# Patient Record
Sex: Male | Born: 1955
Health system: Southern US, Community
[De-identification: ages and names within clinical notes are randomized; demographics above are authoritative.]

## PROBLEM LIST (undated history)

## (undated) DIAGNOSIS — E785 Hyperlipidemia, unspecified: Secondary | ICD-10-CM

## (undated) DIAGNOSIS — Z8679 Personal history of other diseases of the circulatory system: Secondary | ICD-10-CM

## (undated) DIAGNOSIS — K5790 Diverticulosis of intestine, part unspecified, without perforation or abscess without bleeding: Secondary | ICD-10-CM

## (undated) DIAGNOSIS — C4491 Basal cell carcinoma of skin, unspecified: Secondary | ICD-10-CM

## (undated) DIAGNOSIS — L570 Actinic keratosis: Secondary | ICD-10-CM

## (undated) DIAGNOSIS — J310 Chronic rhinitis: Secondary | ICD-10-CM

## (undated) HISTORY — DX: Hyperlipidemia, unspecified: E78.5

## (undated) HISTORY — DX: Diverticulosis of intestine, part unspecified, without perforation or abscess without bleeding: K57.90

## (undated) HISTORY — PX: KNEE ARTHROSCOPY W/ MENISCAL REPAIR: SHX1877

## (undated) HISTORY — DX: Basal cell carcinoma of skin, unspecified: C44.91

## (undated) HISTORY — DX: Personal history of other diseases of the circulatory system: Z86.79

## (undated) HISTORY — DX: Chronic rhinitis: J31.0

## (undated) HISTORY — PX: COLONOSCOPY: SHX174

## (undated) HISTORY — DX: Actinic keratosis: L57.0

---

## 2005-06-02 ENCOUNTER — Ambulatory Visit: Payer: Self-pay | Admitting: Internal Medicine

## 2006-03-09 ENCOUNTER — Ambulatory Visit: Payer: Self-pay | Admitting: Internal Medicine

## 2007-03-13 ENCOUNTER — Ambulatory Visit: Payer: Self-pay | Admitting: Internal Medicine

## 2007-03-13 LAB — CONVERTED CEMR LAB
Basophils Relative: 0.8 % (ref 0.0–1.0)
CO2: 29 meq/L (ref 19–32)
Calcium: 9.3 mg/dL (ref 8.4–10.5)
Eosinophils Absolute: 0.3 10*3/uL (ref 0.0–0.6)
Eosinophils Relative: 6 % — ABNORMAL HIGH (ref 0.0–5.0)
GFR calc Af Amer: 132 mL/min
Glucose, Bld: 108 mg/dL — ABNORMAL HIGH (ref 70–99)
HCT: 43.7 % (ref 39.0–52.0)
Hemoglobin: 15.5 g/dL (ref 13.0–17.0)
Leukocytes, UA: NEGATIVE
Lymphocytes Relative: 28.3 % (ref 12.0–46.0)
MCV: 89.2 fL (ref 78.0–100.0)
Monocytes Absolute: 0.4 10*3/uL (ref 0.2–0.7)
Neutro Abs: 2.9 10*3/uL (ref 1.4–7.7)
Neutrophils Relative %: 57.1 % (ref 43.0–77.0)
Nitrite: NEGATIVE
Sodium: 140 meq/L (ref 135–145)
Specific Gravity, Urine: 1.025 (ref 1.000–1.03)
TSH: 2.94 microintl units/mL (ref 0.35–5.50)
Total Protein, Urine: NEGATIVE mg/dL
Triglycerides: 186 mg/dL — ABNORMAL HIGH (ref 0–149)
WBC: 5 10*3/uL (ref 4.5–10.5)

## 2007-04-12 ENCOUNTER — Ambulatory Visit: Payer: Self-pay | Admitting: Gastroenterology

## 2007-04-26 ENCOUNTER — Ambulatory Visit: Payer: Self-pay | Admitting: Gastroenterology

## 2008-04-25 DIAGNOSIS — Z87891 Personal history of nicotine dependence: Secondary | ICD-10-CM

## 2008-04-25 DIAGNOSIS — Z8679 Personal history of other diseases of the circulatory system: Secondary | ICD-10-CM | POA: Insufficient documentation

## 2008-04-25 DIAGNOSIS — J310 Chronic rhinitis: Secondary | ICD-10-CM | POA: Insufficient documentation

## 2008-04-25 DIAGNOSIS — E7800 Pure hypercholesterolemia, unspecified: Secondary | ICD-10-CM

## 2008-04-26 ENCOUNTER — Ambulatory Visit: Payer: Self-pay | Admitting: Internal Medicine

## 2008-04-26 DIAGNOSIS — G4700 Insomnia, unspecified: Secondary | ICD-10-CM | POA: Insufficient documentation

## 2008-04-26 DIAGNOSIS — M542 Cervicalgia: Secondary | ICD-10-CM

## 2008-06-25 ENCOUNTER — Telehealth (INDEPENDENT_AMBULATORY_CARE_PROVIDER_SITE_OTHER): Payer: Self-pay | Admitting: *Deleted

## 2008-10-23 ENCOUNTER — Ambulatory Visit: Payer: Self-pay | Admitting: Internal Medicine

## 2008-10-23 DIAGNOSIS — N320 Bladder-neck obstruction: Secondary | ICD-10-CM | POA: Insufficient documentation

## 2008-10-23 DIAGNOSIS — F528 Other sexual dysfunction not due to a substance or known physiological condition: Secondary | ICD-10-CM

## 2008-10-25 LAB — CONVERTED CEMR LAB
Nitrite: NEGATIVE
PSA: 2.91 ng/mL (ref 0.10–4.00)
Specific Gravity, Urine: >=1.03 (ref 1.000–1.03)
Total Protein, Urine: NEGATIVE mg/dL
pH: 6 (ref 5.0–8.0)

## 2009-06-12 ENCOUNTER — Ambulatory Visit: Payer: Self-pay | Admitting: Internal Medicine

## 2010-01-19 ENCOUNTER — Telehealth: Payer: Self-pay | Admitting: Internal Medicine

## 2010-01-21 ENCOUNTER — Ambulatory Visit: Payer: Self-pay | Admitting: Internal Medicine

## 2010-01-21 DIAGNOSIS — R358 Other polyuria: Secondary | ICD-10-CM

## 2010-01-23 LAB — CONVERTED CEMR LAB
Bilirubin Urine: NEGATIVE
Calcium: 9.3 mg/dL (ref 8.4–10.5)
Cholesterol: 190 mg/dL (ref 0–200)
Direct LDL: 116.2 mg/dL
GFR calc non Af Amer: 107.51 mL/min (ref >60)
HDL: 37.2 mg/dL — ABNORMAL LOW (ref >39.00)
Ketones, ur: NEGATIVE mg/dL
Sodium: 141 meq/L (ref 135–145)
Total Protein, Urine: NEGATIVE mg/dL
Triglycerides: 232 mg/dL — ABNORMAL HIGH (ref 0.0–149.0)
pH: 6 (ref 5.0–8.0)

## 2010-07-13 ENCOUNTER — Telehealth: Payer: Self-pay | Admitting: Internal Medicine

## 2011-01-01 ENCOUNTER — Telehealth: Payer: Self-pay | Admitting: Adult Health

## 2011-01-24 LAB — CONVERTED CEMR LAB
ALT: 25 units/L (ref 0–53)
AST: 24 units/L (ref 0–37)
AST: 24 units/L (ref 0–37)
Albumin: 3.8 g/dL (ref 3.5–5.2)
Alkaline Phosphatase: 55 units/L (ref 39–117)
Alkaline Phosphatase: 59 units/L (ref 39–117)
BUN: 18 mg/dL (ref 6–23)
Basophils Absolute: 0 10*3/uL (ref 0.0–0.1)
Basophils Absolute: 0 10*3/uL (ref 0.0–0.1)
Basophils Relative: 0.6 % (ref 0.0–3.0)
Bilirubin, Direct: 0.1 mg/dL (ref 0.0–0.3)
CO2: 30 meq/L (ref 19–32)
CRP, High Sensitivity: <1 (ref 0.00–5.00)
Calcium: 9.2 mg/dL (ref 8.4–10.5)
Chloride: 104 meq/L (ref 96–112)
Chloride: 107 meq/L (ref 96–112)
Cholesterol: 202 mg/dL — ABNORMAL HIGH (ref 0–200)
Creatinine, Ser: 0.8 mg/dL (ref 0.4–1.5)
Direct LDL: 146.7 mg/dL
Eosinophils Absolute: 0.2 10*3/uL (ref 0.0–0.7)
Eosinophils Absolute: 0.2 10*3/uL (ref 0.0–0.7)
Eosinophils Relative: 4.9 % (ref 0.0–5.0)
Eosinophils Relative: 6.1 % — ABNORMAL HIGH (ref 0.0–5.0)
GFR calc non Af Amer: 107.76 mL/min (ref >60)
GFR calc non Af Amer: 108 mL/min
Glucose, Bld: 95 mg/dL (ref 70–99)
HCT: 43.7 % (ref 39.0–52.0)
HDL: 28.6 mg/dL — ABNORMAL LOW (ref >39.0)
HDL: 33.6 mg/dL — ABNORMAL LOW (ref >39.00)
Hemoglobin: 15.3 g/dL (ref 13.0–17.0)
LDL Cholesterol: 168 mg/dL — ABNORMAL HIGH (ref 0–99)
Lymphocytes Relative: 32 % (ref 12.0–46.0)
Lymphocytes Relative: 32.6 % (ref 12.0–46.0)
Lymphs Abs: 1.2 10*3/uL (ref 0.7–4.0)
MCHC: 34.4 g/dL (ref 30.0–36.0)
MCHC: 34.9 g/dL (ref 30.0–36.0)
MCV: 90.5 fL (ref 78.0–100.0)
MCV: 91.6 fL (ref 78.0–100.0)
Monocytes Absolute: 0.3 10*3/uL (ref 0.1–1.0)
Monocytes Relative: 8.7 % (ref 3.0–12.0)
Neutro Abs: 2.1 10*3/uL (ref 1.4–7.7)
Neutrophils Relative %: 52 % (ref 43.0–77.0)
Neutrophils Relative %: 54.1 % (ref 43.0–77.0)
Platelets: 182 10*3/uL (ref 150.0–400.0)
Platelets: 232 10*3/uL (ref 150–400)
Potassium: 3.9 meq/L (ref 3.5–5.1)
Potassium: 4.4 meq/L (ref 3.5–5.1)
RBC: 4.77 M/uL (ref 4.22–5.81)
RDW: 11.9 % (ref 11.5–14.6)
Sodium: 140 meq/L (ref 135–145)
TSH: 3.42 microintl units/mL (ref 0.35–5.50)
Total Bilirubin: 0.9 mg/dL (ref 0.3–1.2)
Total Bilirubin: 1.1 mg/dL (ref 0.3–1.2)
Total CHOL/HDL Ratio: 6
Total Protein: 6.7 g/dL (ref 6.0–8.3)
Triglycerides: 121 mg/dL (ref 0.0–149.0)
VLDL: 24.2 mg/dL (ref 0.0–40.0)
VLDL: 28 mg/dL (ref 0–40)
WBC: 3.8 10*3/uL — ABNORMAL LOW (ref 4.5–10.5)
WBC: 4.5 10*3/uL (ref 4.5–10.5)

## 2011-01-26 NOTE — Progress Notes (Signed)
Summary: needs physical this week/ ins coverage runs out  Phone Note Call from Patient   Caller: Spouse Call For: Mallory Schaad Summary of Call: pt's spouse wants pt to have a cpx this week. says she lost her job last week and wil only be able to cover pt's physical if he has it this week.  spousearris meyn # [phone] Initial call taken by: Tivis Ringer, CNA,  January 19, 2010 12:26 PM  Follow-up for Phone Call        Please advise if I can use another slot for pt physical. Pt needs tohave it this week in order for insurance to pay. Please advise. Carron Curie CMA  January 19, 2010 2:21 PM Physical not due, nothing abn on the last one in July 2010 to warrant anothe complete eval though cholesterol was up a bit and if wants to have this rechecked this week just get a fasting lipid profile 272.4 and make the appt anytime this week Follow-up by: Nyoka Cowden MD,  January 19, 2010 2:23 PM  Additional Follow-up for Phone Call Additional follow up Details #1::        per pt wife, pt has been having problems with his prostate and wants thsi checked. I advised he did not need cpx for this. pt scheduled to see MW on 01/21/10 at 10:30. Carron Curie CMA  January 19, 2010 2:45 PM

## 2011-01-26 NOTE — Assessment & Plan Note (Signed)
Summary: Primary svc/ new onset polyuria     Primary Provider/Referring Provider:  [name]  CC:  Followup.  Pt wanted to come in before insurance runs out.  Pt c/o frequent urination x 3 months.  He sometimes get up at least once at night to urinate. Joel Franco  History of Present Illness: 55 yowm  remote minimal smoker with chronic palpitations that typically occur more at rest than they do with activity and not worsened over the last year.    Apr 26, 2008 CPX  October 23, 2008 ov co sev months increased frequency, urgency nocturia,mild dysuria and decreased fos no fever or back pain .   Also decreased libido that he attributes to stress at work.   June 12, 2009 cpx no urinary problems, overall doing fine except for problems staying asleep.  January 21, 2010 Followup.  Pt wanted to come in before insurance runs out.  Pt c/o frequent urination x 3 months.  He sometimes get up at least once at night to urinate. No dysuria or fever or flank pain. Minimal decrease fos.  Current Medications (verified): 1)  Advil Migraine 200 Mg  Caps (Ibuprofen) .... Prn  Allergies (verified): No Known Drug Allergies  Past History:  Past Medical History: HYPERLIPIDEMIA (ICD-272.4)     -  target LDL < 130  based on positive family history and remote  smoking hx CHRONIC RHINITIS (ICD-472.0) PALPITATIONS, HX OF (ICD-V12.50) DIVERTICULOSIS.............................................................................Joel Franco     - See colonoscopy 04/26/07 neck pain positive MRI with right C6/7 protrusion ......................Joel Franco Dr. Magnus Ivan HEALTH MAINTENANCE...................................................................Joel KitchenWert      - CPX  06/12/2009      - DT 3/04      - Pneumovax6/06 and January 21, 2010     Vital Signs:  Patient profile:   55 year old male Weight:      217 pounds O2 Sat:      97 % on Room air Temp:     98.0 degrees F oral Pulse rate:   57 / minute BP sitting:   118 / 85  (left  arm)  Vitals Entered By: Vernie Murders (January 21, 2010 10:54 AM)  O2 Flow:  Room air  Physical Exam  Additional Exam:  Ambulatory healthy appearing anxious wm nad Afeb with normal vital signs  wt 224 > 226 October 23, 2008 > 218 06/12/2009 > 217 January 21, 2010  HEENT: nl dentition, turbinates, and orophanx.  moderate wax impaction of both external ear canals without cough reflex Neck without JVD/Nodes/TM carotid upstrokes normal without bruits. limited abduction to about 30 degrees with no radicular features listed Lungs clear to A and P bilaterally without cough on insp or exp maneuvers RRR no s3 or murmur or increase in P2 no displacement of PMI Abd soft and benign with nl excursion in the supine position. No bruits or organomegaly Ext warm without calf tenderness, cyanosis clubbing or edema  Skin warm and dry without lesions      Sodium                    141 mEq/L                   135-145   Potassium                 4.6 mEq/L                   3.5-5.1   Chloride  104 mEq/L                   96-112   Carbon Dioxide            29 mEq/L                    19-32   Glucose              [H]  101 mg/dL                   29-56   BUN                       12 mg/dL                    2-13   Creatinine                0.8 mg/dL                   0.8-6.5   Calcium                   9.3 mg/dL                   7.8-46.9   GFR                       107.51 mL/min               >60  Tests: (2) Lipid Panel (LIPID)   Cholesterol               190 mg/dL                   6-295     ATP III Classification            Desirable:  < 200 mg/dL                    Borderline High:  200 - 239 mg/dL               High:  > = 240 mg/dL   Triglycerides        [H]  232.0 mg/dL                 2.8-413.2     Normal:  <150 mg/dL     Borderline High:  440 - 199 mg/dL   HDL                  [L]  10.27 mg/dL                 >25.36   VLDL Cholesterol     [H]  46.4 mg/dL                   6.4-40.3  CHO/HDL Ratio:  CHD Risk                             5                    Men          Women     1/2 Average Risk     3.4          3.3     Average Risk  5.0          4.4     2X Average Risk          9.6          7.1     3X Average Risk          15.0          11.0                           Tests: (3) Full Range CRP (FCRP)   Full Range CRP            0.20 mg/L                   0.00-5.00     Note:  An elevated hs-CRP (>5 mg/L) should be repeated after 2 weeks to rule out recent infection or trauma.  Tests: (4) UDip Only (UDIP)   Color                     LT. YELLOW       RANGE:  Yellow;Lt. Yellow   Clarity                   CLEAR                       Clear   Specific Gravity          1.020                       1.000 - 1.030   Urine Ph                  6.0                         5.0-8.0   Protein                   NEGATIVE                    Negative   Urine Glucose             NEGATIVE                    Negative   Ketones                   NEGATIVE                    Negative   Urine Bilirubin           NEGATIVE                    Negative   Blood                     NEGATIVE                    Negative   Urobilinogen              0.2                         0.0 - 1.0   Leukocyte Esterace        NEGATIVE  Negative   Nitrite                   NEGATIVE                    Negative  Tests: (5) Testosterone, Total (TESTO)   Testosterone              418.88 ng/dL                324.40-102.72  Tests: (6) Cholesterol LDL - Direct (DIRLDL)  Cholesterol LDL - Direct                             116.2 mg/dL  Impression & Recommendations:  Problem # 1:  POLYURIA (ZDG-644.03) no evidence of significant renal problem or dm, try empiric cardura, urology f/u prn Orders: Est. Patient Level III (47425) TLB-BMP (Basic Metabolic Panel-BMET) (80048-METABOL) TLB-Udip ONLY (81003-UDIP)  Problem # 2:  INADEQUATE SLEEP HYGIENE (PERSISTENT)  (ICD-307.42)  discussed sleep hygiene  Orders: Est. Patient Level III (95638)  Problem # 3:  HYPERLIPIDEMIA (ICD-272.4)  Orders: TLB-Lipid Panel (80061-LIPID) TLB-CRP-High Sensitivity (C-Reactive Protein) (86140-FCRP)  Labs Reviewed: SGOT: 24 (06/12/2009)   SGPT: 25 (06/12/2009)   HDL:33.60 (06/12/2009), 28.6 (04/26/2008)  LDL:168 (04/26/2008) > 116 January 21, 2010 so at goal  DEL (03/13/2007)  Chol:202 (06/12/2009), 225 (04/26/2008)  Trig:121.0 (06/12/2009), 142 (04/26/2008)  Medications Added to Medication List This Visit: 1)  Cardura 4 Mg Tabs (Doxazosin mesylate) .... One half at bedtime  Other Orders: Pneumococcal Vaccine (75643) Admin 1st Vaccine (32951) TLB-Testosterone, Total (84403-TESTO)  Patient Instructions: 1)  start cardura 4 mg one half at bedtime 2)  Avoid all caffeine after am coffee and no decongestants and read by soft light for at least 30 min before bed same time every night Prescriptions: CARDURA 4 MG TABS (DOXAZOSIN MESYLATE) one half at bedtime  #15 x 11   Entered and Authorized by:   Nyoka Cowden MD   Signed by:   Nyoka Cowden MD on 01/21/2010   Method used:   Print then Give to Patient   RxID:   [id number]    Immunizations Administered:  Pneumonia Vaccine:    Vaccine Type: Pneumovax    Site: left deltoid    Mfr: Merck    Dose: 0.5 ml    Route: IM    Given by: Vernie Murders    Exp. Date: 01/22/2011    Lot #: [id number]    VIS given: 07/24/96 version given January 21, 2010.

## 2011-01-26 NOTE — Progress Notes (Signed)
Summary: Needs Urgent care eval  Phone Note Call from Patient Call back at [phone]   Caller: Ellwood Sayers Call For: Nyala Kirchner Reason for Call: Talk to Nurse Summary of Call: pt's son, 55year old, does not have primary physician.  Pt's son not feeling well, over w/e nauseated, temp 101, sinus drainage, sore throat, some cough.  Would like to come see you and have you be his primary.  Please respond Initial call taken by: Eugene Gavia,  July 13, 2010 4:36 PM  Follow-up for Phone Call        Will forward message to MW to address if ok or not to see pt as PCP.  Aundra Millet Reynolds LPN  July 13, 2010 4:41 PM  ok me but sounds like he needs to be seen more urgently, rec urgent care eval then set up for cpx with me Follow-up by: Nyoka Cowden MD,  July 13, 2010 5:23 PM  Additional Follow-up for Phone Call Additional follow up Details #1::        called and spoke with cindy Narayanan and she is aware of MW recs  to follow up with [facility] and then call and make appt for cpx with MW.  cindy voiced her understanding of this and will call back for appt. Randell Loop CMA  July 13, 2010 5:27 PM

## 2011-01-28 NOTE — Progress Notes (Signed)
Summary: rx request / itching  Phone Note Call from Patient Call back at Home Phone [phone]   Caller: Spouse CYNTHIA Call For: [name] Summary of Call: per spouse- pt was previously given a cream to help with an "itch" in pt's private area. he needs this again (sorry, i did not get any more details- will let nurse do this). [employer]. call pt at home # or cell [phone] Initial call taken by: Tivis Ringer, CNA,  January 01, 2011 3:33 PM  Follow-up for Phone Call        TP, pls advise if okay to refill pt's clotrimazole betamethasone cream, thanks! Vernie Murders  January 01, 2011 4:18 PM   Additional Follow-up for Phone Call Additional follow up Details #1::        that is fine but for the groin area or under skin folds not directly on genitals.  Additional Follow-up by: Rubye Oaks NP,  January 01, 2011 4:21 PM    Additional Follow-up for Phone Call Additional follow up Details #2::    rx sent and pt wife aware of recs. Carron Curie CMA  January 01, 2011 4:28 PM   New/Updated Medications: CLOTRIMAZOLE-BETAMETHASONE 1-0.05 % CREA (CLOTRIMAZOLE-BETAMETHASONE) as directed Prescriptions: CLOTRIMAZOLE-BETAMETHASONE 1-0.05 % CREA (CLOTRIMAZOLE-BETAMETHASONE) as directed  #1 tube x 0   Entered by:   Carron Curie CMA   Authorized by:   Rubye Oaks NP   Signed by:   Carron Curie CMA on 01/01/2011   Method used:   Electronically to        [facility]* (retail)       [address] [place], [place]  [address]       Ph: [phone]       Fax: [phone]   RxID:   [id number]

## 2011-05-14 NOTE — Assessment & Plan Note (Signed)
Joel Franco                             PULMONARY OFFICE NOTE   Joel Franco, Joel Franco                      MRN:          [id number]  DATE:03/13/2007                            DOB:          December 29, 1955    This is a primary service comprehensive health care evaluation.   HISTORY:  This is a 55 year old white male with chronic palpitations  that typically occur more at rest than they do with activity and not  worsened over the last year.  She also has intermittent nasal congestion  but controls this with p.r.n. over-the-counter medications with no  significant seasonal or weather-related variation.  He denies any  exertional chest pain, orthopnea, PND or leg swelling.  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  He also  denies any orthopnea, PND, TIA or claudication symptoms.   PAST MEDICAL HISTORY:  1. Benign palpitations, nonexertional, worse with caffeine.  2. Chronic rhinitis, on p.r.n. Flonase seasonally.  3. Hyperlipidemia with relatively low HDL and a target LDL less than      30 based on male gender and past positive personal history of      smoking as well as family history of heart disease.   ALLERGIES:  NONE KNOWN.   MEDICATIONS:  None regularly.   SOCIAL HISTORY:  He quit smoking 15 years ago.  He works as a  Microbiologist.   FAMILY HISTORY:  1. Positive for heart disease in his mother in her 65's.  2. Brother has allergies.  3. Positive for ischemic heart disease also in his father at age 40.  4. One of his siblings has Parkinson's disease and possible liver      cancer.   REVIEW OF SYSTEMS:  Taken in detail on the worksheet and significant for  the problems as outlined above.   PHYSICAL EXAMINATION:  This is a stoic, pleasant ambulatory white male  in no acute distress.  He is afebrile with normal vital signs.  Weight  is 219 pounds, which is no change from a year ago.  HEENT:  Moderate bilateral wax retention was present.  Nasal turbinates  were normal.  Limited ocular exam was benign.  Oropharynx was clear.  Dentition was intact.  NECK:  Supple without cervical adenopathy or tenderness.  Carotid  upstrokes were brisk without any bruits.  CHEST:  Completely clear bilaterally to auscultation and percussion with  excellent air movement.  HEART:  A regular rhythm without murmur, gallop or rub present.  No  displacement of PMI.  There was mild pectus excavatum deformity.  ABDOMEN:  Soft, benign with no palpable organomegaly or masses or  tenderness, no bruits were present.  Femoral pulses were present  bilaterally.  GENITOURINARY:  Testes descended bilaterally, no nodules, no evidence of  inguinal hernia.  RECTAL:  Minimal BPH, smooth texture, no nodules, stool guaiac was  negative.  EXTREMITIES:  Warm without any calf tenderness, cyanosis, clubbing or  edema.  Pedal pulses were stronger  in the posterior tibial band, the  dorsalis pedis a in symmetric fashion bilaterally.  SKIN:  Warm and dry with no lesions.  MUSCULOSKELETAL:  Unremarkable.  NEUROLOGIC:  Unremarkable.   LAB DATA:  A CBC was normal except for 6% eosinophils.  A BMET was  normal.  Total cholesterol was 207 with an LDL of 131, HDL of 34, TSH  was normal, CRP was less than 1.  Urinalysis was unremarkable.  An EKG  was normal.   IMPRESSION:  1. Benign palpitations with no increase with activity and excellent      exercise tolerance with no exertional chest pain or dyspnea.  I      reassured him that unless there is a change in the pattern of the      palpitations no further workup is necessary.  2. Hyperlipidemia.  Has resolved, with excellent reduction in LDL with      diet, and reasonably, although his HDL remains relatively low, no      indication for therapy at this point.  3. Positive family history for heart disease.  Therefore,  LDL target      less than 914, which I discussed with him.  4. Remote history of smoking, without sequelae today.  5. Mild seasonal rhinitis for which Flonase on an as needed basis      seems to be adequate.  6. Chronic neck pain for which he sees a chiropractor now; although,      note that he was told he had right C7, C6 disk protrusion in      February, 2007.  He denies any symptoms involving his right upper      extremity and described the pattern that we usually see with a C6-7      neuropathy and to be on the lookout for this and return to Dr.      Magnus Franco if this recurs.  7. General health maintenance.  He was already updated on tetanus and      Pneumovax previously.  However, he is now due for colorectal      screening.     Joel Franco. Joel Sires, MD, [facility]  Electronically Signed    MBW/MedQ  DD: 03/13/2007  DT: 03/14/2007  Job #: [id number]

## 2011-07-27 ENCOUNTER — Other Ambulatory Visit (INDEPENDENT_AMBULATORY_CARE_PROVIDER_SITE_OTHER): Payer: Managed Care, Other (non HMO)

## 2011-07-27 ENCOUNTER — Ambulatory Visit (INDEPENDENT_AMBULATORY_CARE_PROVIDER_SITE_OTHER): Payer: Managed Care, Other (non HMO) | Admitting: Internal Medicine

## 2011-07-27 ENCOUNTER — Other Ambulatory Visit: Payer: Self-pay | Admitting: Internal Medicine

## 2011-07-27 ENCOUNTER — Encounter: Payer: Self-pay | Admitting: Internal Medicine

## 2011-07-27 DIAGNOSIS — Z8679 Personal history of other diseases of the circulatory system: Secondary | ICD-10-CM

## 2011-07-27 DIAGNOSIS — Z0000 Encounter for general adult medical examination without abnormal findings: Secondary | ICD-10-CM

## 2011-07-27 DIAGNOSIS — E785 Hyperlipidemia, unspecified: Secondary | ICD-10-CM

## 2011-07-27 DIAGNOSIS — M545 Low back pain, unspecified: Secondary | ICD-10-CM

## 2011-07-27 DIAGNOSIS — F528 Other sexual dysfunction not due to a substance or known physiological condition: Secondary | ICD-10-CM

## 2011-07-27 DIAGNOSIS — G4700 Insomnia, unspecified: Secondary | ICD-10-CM

## 2011-07-27 LAB — CBC WITH DIFFERENTIAL/PLATELET
Basophils Absolute: 0.1 10*3/uL (ref 0.0–0.1)
Eosinophils Relative: 6 % — ABNORMAL HIGH (ref 0.0–5.0)
HCT: 44.9 % (ref 39.0–52.0)
Hemoglobin: 15.2 g/dL (ref 13.0–17.0)
Lymphocytes Relative: 26.4 % (ref 12.0–46.0)
Monocytes Relative: 7.6 % (ref 3.0–12.0)
Neutro Abs: 2.7 10*3/uL (ref 1.4–7.7)
RBC: 4.87 Mil/uL (ref 4.22–5.81)
RDW: 12.3 % (ref 11.5–14.6)
WBC: 4.6 10*3/uL (ref 4.5–10.5)

## 2011-07-27 LAB — URINALYSIS
Hgb urine dipstick: NEGATIVE
Total Protein, Urine: NEGATIVE
Urine Glucose: NEGATIVE

## 2011-07-27 LAB — BASIC METABOLIC PANEL
Calcium: 9.3 mg/dL (ref 8.4–10.5)
GFR: 94.52 mL/min (ref >60.00)
Potassium: 4.7 mEq/L (ref 3.5–5.1)
Sodium: 138 mEq/L (ref 135–145)

## 2011-07-27 LAB — HEPATIC FUNCTION PANEL
AST: 20 U/L (ref 0–37)
Albumin: 4.2 g/dL (ref 3.5–5.2)

## 2011-07-27 LAB — LIPID PANEL
Cholesterol: 211 mg/dL — ABNORMAL HIGH (ref 0–200)
Triglycerides: 355 mg/dL — ABNORMAL HIGH (ref 0.0–149.0)
VLDL: 71 mg/dL — ABNORMAL HIGH (ref 0.0–40.0)

## 2011-07-27 LAB — LDL CHOLESTEROL, DIRECT: Direct LDL: 114 mg/dL

## 2011-07-27 NOTE — Assessment & Plan Note (Signed)
No radicular features, f/u ortho if not responding to nsaids

## 2011-07-27 NOTE — Progress Notes (Signed)
Quick Note:  Spoke with pt and notified of results per Dr. [name]. Pt verbalized understanding and denied any questions.  ______ 

## 2011-07-27 NOTE — Patient Instructions (Signed)
Ok to leave off doxazosin as long as no problem urinating and blood pressure ok  Ok to use etoldolac 500 one twice daily with meals as needed for joint/ back pain - if not better in 2 weeks we need to refer you to a back specialist (orhopedic)  Please schedule a follow up visit in 3 months but call sooner if needed

## 2011-07-27 NOTE — Assessment & Plan Note (Signed)
Adequate control on present rx, reviewed  

## 2011-07-27 NOTE — Assessment & Plan Note (Signed)
Offered viagra or referral to urology, declined

## 2011-07-27 NOTE — Progress Notes (Signed)
Subjective:     Patient ID: Joel Franco, male   DOB: October 05, 1956, 55 y.o.   MRN: [id number]  HPI  2 yowm remote minimal smoker with chronic palpitations  that typically occur more at rest than they do with activity and not progressive, if anything less noticeable as ages.  June 12, 2009 cpx no urinary problems, overall doing fine except for problems staying asleep. rec sleep hygiene reviewed  07/27/2011 cpx/ [name] new cc low back pain p straining pulling lawnmower out of ditch 3 weeks ago, has etoldolac 500 mg only took once so far not sure it helped, no radicular features.   Pt denies any significant sore throat, dysphagia, itching, sneezing,  nasal congestion or excess/ purulent secretions,  fever, chills, sweats, unintended wt loss, pleuritic or exertional cp, hempoptysis, orthopnea pnd or leg swelling.    Also denies any obvious fluctuation of symptoms with weather or environmental changes or other aggravating or alleviating factors.       Past Medical History:  HYPERLIPIDEMIA (ICD-272.4)  - target LDL < 130 based on positive family history and remote smoking hx  CHRONIC RHINITIS (ICD-472.0)  PALPITATIONS, HX OF (ICD-V12.50)  DIVERTICULOSIS.............................................................................Marland KitchenJarold Motto  - See colonoscopy 04/26/07  neck pain positive MRI with right C6/7 protrusion ......................Marland Kitchen Dr. 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 Negative for fever, chills, diaphoresis, activity change, appetite change and unexpected weight change.  HENT: Negative for hearing loss, ear pain, nosebleeds, congestion, sore throat, facial swelling, rhinorrhea, sneezing, mouth sores, trouble swallowing, neck pain, neck stiffness, dental problem, voice change, postnasal drip, sinus pressure, tinnitus and ear discharge.   Eyes: Negative for photophobia, discharge, itching and visual disturbance.  Respiratory: Negative for apnea, cough, choking, chest tightness, shortness of breath, wheezing and stridor.   Cardiovascular: Negative for chest pain, palpitations and leg swelling.  Gastrointestinal: Positive for constipation. Negative for nausea, vomiting, abdominal pain, blood in stool and abdominal distention.  Genitourinary: Negative for dysuria, urgency, frequency, hematuria, flank pain, decreased urine volume and difficulty urinating.  Musculoskeletal: Positive for back pain. Negative for myalgias, joint swelling, arthralgias and gait problem.  Skin: Negative for color change, pallor and rash.  Neurological: Negative for dizziness, tremors, seizures, syncope, speech difficulty, weakness, light-headedness, numbness and headaches.  Hematological: Negative for adenopathy. Does not bruise/bleed easily.  Psychiatric/Behavioral: Positive for sleep disturbance. Negative for confusion and agitation. The patient is nervous/anxious.        Objective:   Physical Exam    amb wm nad  Wt 224 07/27/2011   HEENT: nl dentition, turbinates, and orophanx. Nl external ear canals without cough reflex   NECK :  without JVD/Nodes/TM/ nl carotid upstrokes bilaterally   LUNGS: no acc muscle use, clear to A and P bilaterally without cough on insp or exp maneuvers   CV:  RRR  no s3 or murmur or increase in P2, no edema   ABD:  soft and nontender with nl excursion in the supine position. No bruits or organomegaly, bowel sounds nl  MS:  warm  without deformities, calf tenderness, cyanosis or clubbing, neg slr bilaterally  SKIN: warm and dry without lesions    NEURO:  alert, approp, no deficits   GU  Testes down bilaterally  Neg IH, nodules  Rectal:  Minimal bph, smooth texture, stool G neg  Assessment:         Plan:

## 2011-07-27 NOTE — Assessment & Plan Note (Signed)
Nl tsh,   The problem of recurrent insomnia is discussed. Avoidance of caffeine sources is strongly encouraged. Sleep hygiene issues are reviewed. The use of sedative hypnotics for temporary relief is appropriate; we discussed the addictive nature of these drugs, and a one-time only prescription for prn use of a hypnotic is given, to use no more than 3 times per week for 2-3 weeks.

## 2011-07-28 ENCOUNTER — Telehealth: Payer: Self-pay | Admitting: Internal Medicine

## 2011-07-28 DIAGNOSIS — N529 Male erectile dysfunction, unspecified: Secondary | ICD-10-CM

## 2011-07-28 NOTE — Telephone Encounter (Signed)
Pt returning call can be reached at [phone].Joel Franco

## 2011-07-28 NOTE — Telephone Encounter (Signed)
LMTCB

## 2011-07-28 NOTE — Telephone Encounter (Signed)
PT RETURNED CALL FROM TRIAGE. Joel Franco

## 2011-07-28 NOTE — Telephone Encounter (Signed)
Spoke with pt.  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  His other question is that he states that he discussed with Dr [name] ? Of Low T and wants to know if he can start tx for this? I advised MW out of the office again until tomorrow, but will forward him the msg and once addressed will call him. Please advise, thanks!

## 2011-07-29 ENCOUNTER — Telehealth: Payer: Self-pay | Admitting: Internal Medicine

## 2011-07-29 NOTE — Telephone Encounter (Signed)
We don't normally screen or treat men for testerone issues, this is done by urology and I prefer he establish with one (I rec Dr Annabell Howells) and let him discuss this as there is an increased risk of prostate ca assoc with testerone which would need to be assessed and followed carefully

## 2011-07-29 NOTE — Telephone Encounter (Signed)
LMOMTCB x 1 

## 2011-07-30 NOTE — Telephone Encounter (Signed)
Forms were completed by MW - LMOMTCB ? Does pt want to pick these forms up and take to local DMV?  There is no mailing address on the forms.  Forms are on triage's board until pt returns call.

## 2011-07-30 NOTE — Telephone Encounter (Signed)
lmomtcb x1 

## 2011-08-02 NOTE — Telephone Encounter (Signed)
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

## 2011-08-02 NOTE — Telephone Encounter (Signed)
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 Cert. Unit [address] [place], [place] [address].Joel Franco

## 2011-08-02 NOTE — Telephone Encounter (Signed)
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 Copies in Joel Franco's scan folder and my VIP folder.  Still need to inform him of recs regarding MW response regarding testosterone tx- LMTCB

## 2011-08-02 NOTE — Telephone Encounter (Signed)
Pt aware papers at the front desk for p/u--the packet includes the original forms, a copy of the forms for his records as well as the medical examiner's card--also copy put in mw's scan folder to be scanned to the chart

## 2011-08-03 NOTE — Telephone Encounter (Signed)
Pt returning the call of the nurse from yesterday.  Pt stated an alternate number he can be reached at is [phone].  Joel Franco

## 2011-08-04 ENCOUNTER — Telehealth: Payer: Self-pay | Admitting: Internal Medicine

## 2011-08-04 NOTE — Telephone Encounter (Signed)
I spoke with the pt and advised of MW recs and pt ok with referral for urology. Order sent. Pt also aware that copy of this forms and examiner card is at front desk for pick-up. Carron Curie, CMA

## 2011-08-04 NOTE — Telephone Encounter (Signed)
This is a duplicate message, see note from 07-28-11. Carron Curie, CMA

## 2011-08-05 ENCOUNTER — Telehealth: Payer: Self-pay | Admitting: Internal Medicine

## 2011-08-05 MED ORDER — CLOTRIMAZOLE-BETAMETHASONE 1-0.05 % EX CREA: TOPICAL_CREAM | Freq: Two times a day (BID) | CUTANEOUS | Status: DC

## 2011-08-05 NOTE — Telephone Encounter (Signed)
Refill sent. LMTCBx1. Katerine Morua, CMA  

## 2011-08-06 NOTE — Telephone Encounter (Signed)
Spoke with pt and notified that rx was sent to pharm. Pt verbalized understanding.

## 2011-08-11 ENCOUNTER — Telehealth: Payer: Self-pay | Admitting: Internal Medicine

## 2011-08-11 NOTE — Telephone Encounter (Signed)
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 The patient is aware of this and will come back by so we can make a copy and fax this for him to [phone].. The phone number is [phone]

## 2011-08-11 NOTE — Telephone Encounter (Signed)
Copy made of card and faxed to [facility] in [facility] for the patient. Copy then placed in MW scan folder.

## 2011-08-20 ENCOUNTER — Telehealth: Payer: Self-pay | Admitting: Internal Medicine

## 2011-08-20 NOTE — Telephone Encounter (Signed)
Dr. [name], leslie has placed this paperwork in your to do.

## 2011-08-24 NOTE — Telephone Encounter (Signed)
LMTCB

## 2011-08-24 NOTE — Telephone Encounter (Signed)
The problem is the certificate itself needs an update, not the paperwork

## 2011-08-25 NOTE — Telephone Encounter (Signed)
lmomtcb  

## 2011-08-26 NOTE — Telephone Encounter (Signed)
PT RETURNED CALL. CALL HIS CEL #. Joel Franco

## 2011-08-26 NOTE — Telephone Encounter (Signed)
Spoke with pt and made him aware his cdl has expired and we can't process his paper work until he gets this renewed. Pt is going to pick up his paperwork he dropped off. This has been placed upfront

## 2011-09-06 ENCOUNTER — Telehealth: Payer: Self-pay | Admitting: Internal Medicine

## 2011-09-06 NOTE — Telephone Encounter (Signed)
Spoke with pt. 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 I am unsure if he was sure of what exactly was needed, he just dropped off the same forms that we had already filled out. I placed them in MW lookat. I think all he is asking is for [place] to document card was lost and have MW sign and date.

## 2011-09-07 NOTE — Telephone Encounter (Signed)
Spoke with pt and notified that we have updated the form as he requested and have mailed back to [facility] per his request. Pt verbalized understanding and states nothing further needed.

## 2011-09-27 ENCOUNTER — Telehealth: Payer: Self-pay | Admitting: Internal Medicine

## 2011-09-27 DIAGNOSIS — M545 Low back pain: Secondary | ICD-10-CM

## 2011-09-27 NOTE — Telephone Encounter (Signed)
Spoke with the pt and he states that the etodolac has not helped his back pain and that MW mentioned referring him to an orthopedic doctor if the med did not help.  Per last OV note MW states if med does not help then to place referral for ortho. Order placed. Pt aware.Carron Curie, CMA

## 2011-10-12 ENCOUNTER — Telehealth: Payer: Self-pay | Admitting: Internal Medicine

## 2011-10-12 NOTE — Telephone Encounter (Signed)
Spoke to [employer] they say they have left several messages for this pt , spoke to pt he is aware and was given the # to call them also

## 2012-03-22 ENCOUNTER — Telehealth: Payer: Self-pay | Admitting: Internal Medicine

## 2012-03-22 NOTE — Telephone Encounter (Signed)
LMOMTCB x 1 

## 2012-03-22 NOTE — Telephone Encounter (Signed)
Called spoke with patient who reported having difficulty sleeping x27month with waking up multiple times during the night after 2-3 hours of sleep, and falling back asleep after 1-2 hours.  Reports increased stress.  Denies taking naps during the day.  Pt stated his daughter mentioned Remus Loffler and wonders if this would be appropriate.  Aware MW not in office until tomorrow morning and is okay with a call back then.  [employer],  Dr [name] please advise, thanks.  Allergies  Allergen Reactions  . Amoxicillin    Patient last seen 7.31.12 for cpx.

## 2012-03-22 NOTE — Telephone Encounter (Signed)
Prefer xanax 0.5 mg qhs prn but only #15 and return to ov before runs out

## 2012-03-23 MED ORDER — ALPRAZOLAM 0.5 MG PO TABS: 0.5000 mg | ORAL_TABLET | Freq: Every evening | ORAL | Status: DC | PRN

## 2012-03-23 NOTE — Telephone Encounter (Signed)
Pt returned call. Joel Franco  

## 2012-03-23 NOTE — Telephone Encounter (Signed)
Pt advised of xanax and rx sent to [facility]. Also appt set for 04-05-12 at 9am. Carron Curie, CMA

## 2012-03-23 NOTE — Telephone Encounter (Signed)
LMTCBx2. Jerrion Tabbert, CMA  

## 2012-04-05 ENCOUNTER — Encounter: Payer: Managed Care, Other (non HMO) | Admitting: Internal Medicine

## 2012-04-05 NOTE — Progress Notes (Signed)
 This encounter was created in error - please disregard.

## 2012-08-24 ENCOUNTER — Encounter: Payer: Managed Care, Other (non HMO) | Admitting: Internal Medicine

## 2012-08-24 NOTE — Progress Notes (Signed)
 This encounter was created in error - please disregard.

## 2012-12-04 ENCOUNTER — Telehealth: Payer: Self-pay | Admitting: Internal Medicine

## 2012-12-04 NOTE — Telephone Encounter (Signed)
Pt called back & asked to be reached at [phone].  Joel Franco

## 2012-12-04 NOTE — Telephone Encounter (Signed)
LMTCB x 1 

## 2012-12-04 NOTE — Telephone Encounter (Signed)
Pt c/o stomach bug for the past week. He states he has had no diarrhea since last Thurs 12/5 but now has stomach pain after he eats.  I have instructed the pt to stay away from spicy foods, greasy foods or acidic foods and told him he should eat a very bland diet for several days. Rice, toast, applesauce, bananas, etc...  Dr/ [name], pls advise/

## 2012-12-04 NOTE — Telephone Encounter (Signed)
See previous entry

## 2012-12-04 NOTE — Telephone Encounter (Signed)
I've not seen him since 06/2011 and he's missed the last 2 appts so really can't try to treat him over the phone.   rx Clear liquid  Ov asap with me or NP

## 2012-12-05 NOTE — Telephone Encounter (Signed)
ATC pt no answer. LMOMTCB 

## 2012-12-06 NOTE — Telephone Encounter (Signed)
Pt aware and states his stomach issue has improved slightly and doesn't feel he needs an appt right now. He will call to schedule an appt if his sxs change or get worse.

## 2013-01-19 ENCOUNTER — Ambulatory Visit (INDEPENDENT_AMBULATORY_CARE_PROVIDER_SITE_OTHER)
Admission: RE | Admit: 2013-01-19 | Discharge: 2013-01-19 | Disposition: A | Discharge status: Home / Self Care | Payer: Managed Care, Other (non HMO) | Source: Ambulatory Visit | Attending: Internal Medicine | Admitting: Internal Medicine

## 2013-01-19 ENCOUNTER — Ambulatory Visit (INDEPENDENT_AMBULATORY_CARE_PROVIDER_SITE_OTHER): Payer: Managed Care, Other (non HMO) | Admitting: Internal Medicine

## 2013-01-19 ENCOUNTER — Encounter: Payer: Self-pay | Admitting: Internal Medicine

## 2013-01-19 ENCOUNTER — Other Ambulatory Visit (INDEPENDENT_AMBULATORY_CARE_PROVIDER_SITE_OTHER): Payer: Managed Care, Other (non HMO)

## 2013-01-19 VITALS — BP 120/80 | HR 60 | Temp 97.1°F | Ht 75.0 in | Wt 216.0 lb

## 2013-01-19 DIAGNOSIS — Z0000 Encounter for general adult medical examination without abnormal findings: Secondary | ICD-10-CM

## 2013-01-19 DIAGNOSIS — E785 Hyperlipidemia, unspecified: Secondary | ICD-10-CM

## 2013-01-19 DIAGNOSIS — Z8679 Personal history of other diseases of the circulatory system: Secondary | ICD-10-CM

## 2013-01-19 LAB — URINALYSIS
Nitrite: NEGATIVE
Specific Gravity, Urine: 1.01 (ref 1.000–1.030)
Urine Glucose: NEGATIVE
Urobilinogen, UA: 0.2 (ref 0.0–1.0)

## 2013-01-19 LAB — TSH: TSH: 3.51 u[IU]/mL (ref 0.35–5.50)

## 2013-01-19 LAB — LIPID PANEL: Cholesterol: 217 mg/dL — ABNORMAL HIGH (ref 0–200)

## 2013-01-19 LAB — CBC WITH DIFFERENTIAL/PLATELET
Basophils Relative: 0.9 % (ref 0.0–3.0)
Eosinophils Absolute: 0.3 10*3/uL (ref 0.0–0.7)
Lymphs Abs: 1.3 10*3/uL (ref 0.7–4.0)
MCHC: 35 g/dL (ref 30.0–36.0)
MCV: 89.5 fl (ref 78.0–100.0)
Monocytes Absolute: 0.4 10*3/uL (ref 0.1–1.0)
Neutrophils Relative %: 56 % (ref 43.0–77.0)
Platelets: 217 10*3/uL (ref 150.0–400.0)

## 2013-01-19 LAB — BASIC METABOLIC PANEL
BUN: 19 mg/dL (ref 6–23)
CO2: 26 mEq/L (ref 19–32)
Chloride: 105 mEq/L (ref 96–112)
Creatinine, Ser: 0.9 mg/dL (ref 0.4–1.5)

## 2013-01-19 LAB — HEPATIC FUNCTION PANEL
Bilirubin, Direct: 0 mg/dL (ref 0.0–0.3)
Total Bilirubin: 0.9 mg/dL (ref 0.3–1.2)
Total Protein: 7.2 g/dL (ref 6.0–8.3)

## 2013-01-19 NOTE — Progress Notes (Signed)
Subjective:     Patient ID: Joel Franco, male   DOB: 01/18/1956     MRN: [id number]  HPI  71 yowm remote minimal smoker with chronic palpitations  that typically occur more at rest than they do with activity and not progressive, if anything less noticeable as ages.  June 12, 2009 cpx no urinary problems, overall doing fine except for problems staying asleep. rec sleep hygiene reviewed  07/27/2011 cpx/ [name] new cc low back pain p straining pulling lawnmower out of ditch 3 weeks ago, has etoldolac 500 mg only took once so far not sure it helped, no radicular features. rec Ok to leave off doxazosin as long as no problem urinating and blood pressure ok  Ok to use etoldolac 500 one twice daily with meals as needed for joint/ back pain - if not better in 2 weeks we need to refer you to a back specialist (orhopedic)  04/05/2012 f/u ov/[name] cc missed appt  08/24/2012 f/u ov/[name] cc missed appt  01/19/2013 CPX, [name], no cp or sob or cough or flare of palpitations   Also denies any obvious fluctuation of symptoms with weather or environmental changes or other aggravating or alleviating factors.   ROS  The following are not active complaints unless bolded sore throat, dysphagia, dental problems, itching, sneezing,  nasal congestion or excess/ purulent secretions, ear ache,   fever, chills, sweats, unintended wt loss, pleuritic or exertional cp, hemoptysis,  orthopnea pnd or leg swelling, presyncope, palpitations, heartburn, abdominal pain, anorexia, nausea, vomiting, diarrhea  or change in bowel or urinary habits, change in stools or urine, dysuria,hematuria,  rash, arthralgias, visual complaints, headache, numbness weakness or ataxia or problems with walking or coordination,  change in mood/affect or memory.          Past Medical History:  HYPERLIPIDEMIA (ICD-272.4)  - target LDL < 130 based on positive family history and remote smoking hx  CHRONIC RHINITIS (ICD-472.0)  PALPITATIONS, HX OF  (ICD-V12.50)  DIVERTICULOSIS.............................................................................Marland KitchenJarold Franco  - See colonoscopy 04/26/07  neck pain positive MRI with right C6/7 protrusion ......................Marland Kitchen Dr. 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  external ear canals dry wax L > R without cough reflex   NECK :  without JVD/Nodes/TM/ nl carotid upstrokes bilaterally   LUNGS: no acc muscle use, clear to A and P bilaterally without cough on insp or exp maneuvers   CV:  RRR  no s3 or murmur or increase in P2, no edema  DP > PT  ABD:  soft and nontender with nl excursion in the supine position. No bruits or organomegaly, bowel sounds nl  MS:  warm without deformities, calf tenderness, cyanosis or clubbing, neg slr bilaterally  SKIN: warm and dry without lesions    NEURO:  alert, approp, no deficits   GU  Testes down bilaterally  Neg IH, nodules  Rectal:  Minimal bph, smooth  texture, stool G neg  Assessment:         Plan:

## 2013-01-19 NOTE — Patient Instructions (Addendum)
Please remember to go to the lab and x-ray department downstairs for your tests - we will call you with the results when they are available.      

## 2013-01-20 DIAGNOSIS — Z0000 Encounter for general adult medical examination without abnormal findings: Secondary | ICD-10-CM | POA: Insufficient documentation

## 2013-01-20 NOTE — Assessment & Plan Note (Signed)
Lab Results  Component Value Date   CHOL 217* 01/19/2013   HDL 32.30* 01/19/2013   LDLCALC 168* 04/26/2008   LDLDIRECT 138.7 01/19/2013   TRIG 154.0* 01/19/2013   CHOLHDL 7 01/19/2013   Probably Adequate control on present rx, reviewed

## 2013-01-20 NOTE — Assessment & Plan Note (Signed)
Adequate control on present rx, reviewed avoid caffeine, stress, decongestants

## 2013-01-20 NOTE — Assessment & Plan Note (Signed)
Up to date on standard vaccinations and screeing  Re PSA given min smooth bph, neg fm hx and race, Discussed in detail all the  indications, usual  risks and alternatives  relative to the benefits with patient who agrees to proceed with standard DME only.

## 2013-01-22 ENCOUNTER — Telehealth: Payer: Self-pay | Admitting: Pulmonary Disease

## 2013-01-22 ENCOUNTER — Telehealth: Payer: Self-pay | Admitting: Internal Medicine

## 2013-01-22 NOTE — Telephone Encounter (Signed)
Called and spoke with pt and reviewed his labs again Specifically I went over the FLP since this was his concern I went over all of the numbers with him and he verbalized understanding I have mailed him a low fat low cholesterol diet to help him better understand what foods to avoid Nothing further needed per pt

## 2013-01-22 NOTE — Telephone Encounter (Signed)
Result Note     Call patient : Studies are unremarkable, no change in recs   Spoke with patient, made him aware of results as listed above by Dr. [name]. Patient verbalized understanding. Nothing further needed at this time.

## 2013-01-22 NOTE — Progress Notes (Signed)
Quick Note:  See 1/27 phone note ______

## 2013-01-22 NOTE — Progress Notes (Signed)
Quick Note:  Spoke with patient, made him aware of results as listed below by Dr. [name]. Patient verbalized understanding. Nothing further needed at this time. ______

## 2013-08-13 ENCOUNTER — Telehealth: Payer: Self-pay | Admitting: Internal Medicine

## 2013-08-13 NOTE — Telephone Encounter (Signed)
Paper has been signed by Dr. [name] and placed upfront for pickup. Called patient to make him aware of this and nothing further needed at this time

## 2013-11-21 ENCOUNTER — Encounter: Payer: Self-pay | Admitting: *Deleted

## 2013-11-27 ENCOUNTER — Ambulatory Visit (INDEPENDENT_AMBULATORY_CARE_PROVIDER_SITE_OTHER): Payer: Managed Care, Other (non HMO)

## 2013-11-27 ENCOUNTER — Ambulatory Visit: Payer: Self-pay

## 2013-11-27 DIAGNOSIS — M79609 Pain in unspecified limb: Secondary | ICD-10-CM

## 2013-11-27 DIAGNOSIS — M7750 Other enthesopathy of unspecified foot: Secondary | ICD-10-CM

## 2013-11-27 MED ORDER — MELOXICAM 15 MG PO TABS: 15.0000 mg | ORAL_TABLET | Freq: Every day | ORAL | Status: DC
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
## 2013-11-27 NOTE — Patient Instructions (Signed)
ICE INSTRUCTIONS  Apply ice or cold pack to the affected area at least 3 times a day for 10-15 minutes each time.  You should also use ice after prolonged activity or vigorous exercise.  Do not apply ice longer than 20 minutes at one time.  Always keep a cloth between your skin and the ice pack to prevent burns.  Being consistent and following these instructions will help control your symptoms.  We suggest you purchase a gel ice pack because they are reusable and do bit leak.  Some of them are designed to wrap around the area.  Use the method that works best for you.  Here are some other suggestions for icing.   Use a frozen bag of peas or corn-inexpensive and molds well to your body, usually stays frozen for 10 to 20 minutes.  Wet a towel with cold water and squeeze out the excess until it's damp.  Place in a bag in the freezer for 20 minutes. Then remove and use.  Alternate warm compress ice pack 10-15 minutes each repeat 2 or 3 times every day  Some shoe recommendations as alternative to the new balance. Consider Asics, or Brooks running shoes. Consider a stability model shoe rather than motion control type shoe. Consider a medial firmness shoe rather than extra firm shoe.

## 2013-11-28 ENCOUNTER — Ambulatory Visit: Payer: Self-pay

## 2014-04-15 ENCOUNTER — Encounter: Payer: Self-pay | Admitting: Internal Medicine

## 2014-04-15 ENCOUNTER — Telehealth: Payer: Self-pay | Admitting: Internal Medicine

## 2014-04-15 ENCOUNTER — Ambulatory Visit (INDEPENDENT_AMBULATORY_CARE_PROVIDER_SITE_OTHER): Payer: Managed Care, Other (non HMO) | Admitting: Internal Medicine

## 2014-04-15 VITALS — BP 118/64 | HR 56 | Temp 98.3°F | Ht 75.0 in | Wt 223.0 lb

## 2014-04-15 DIAGNOSIS — K1230 Oral mucositis (ulcerative), unspecified: Secondary | ICD-10-CM

## 2014-04-15 DIAGNOSIS — K121 Other forms of stomatitis: Secondary | ICD-10-CM

## 2014-04-15 MED ORDER — MAGIC MOUTHWASH W/LIDOCAINE: 5.0000 mL | Freq: Four times a day (QID) | ORAL | Status: DC | PRN

## 2014-04-15 NOTE — Telephone Encounter (Signed)
Spoke with pt. appt scheduled to come in and see MW this afternoon. Nothing further needed

## 2014-04-15 NOTE — Patient Instructions (Addendum)
Magic mouthwash 1-2 tsp four times daily as needed.   Key is  avoidance of late meals, excessive alcohol, smoking cessation, and avoid fatty foods, chocolate, peppermint, colas, red wine, and acidic juices such as orange juice.  NO MINT OR MENTHOL PRODUCTS SO NO COUGH DROPS  USE SUGARLESS CANDY INSTEAD (jolley ranchers or Clinical research associate ) NO OIL BASED VITAMINS - use powdered substitutes.    Please schedule a follow up visit in 3 months but call sooner if needed for CPX

## 2014-04-15 NOTE — Progress Notes (Signed)
Subjective:     Patient ID: Joel Franco, male   DOB: [birth year]     MRN: [id number]    Brief patient profile:  41 yowm remote minimal smoker with chronic palpitations  that typically occur more at rest than they do with activity and not progressive, if anything less noticeable as ages.   History of Present Illness  June 12, 2009 cpx no urinary problems, overall doing fine except for problems staying asleep. rec sleep hygiene reviewed  07/27/2011 cpx/ Doraine Schexnider new cc low back pain p straining pulling lawnmower out of ditch 3 weeks ago, has etoldolac 500 mg only took once so far not sure it helped, no radicular features. rec Ok to leave off doxazosin as long as no problem urinating and blood pressure ok  Ok to use etoldolac 500 one twice daily with meals as needed for joint/ back pain - if not better in 2 weeks we need to refer you to a back specialist (orhopedic)  04/05/2012 f/u ov/Leonard Hendler cc missed appt  08/24/2012 f/u ov/Marvelous Bouwens cc missed appt  01/19/2013 CPX, Mayia Megill, no cp or sob or cough or flare of palpitations  rec Please remember to go to the lab and x-ray department downstairs for your tests - we will call you with the results when they are available.    04/15/2014 acute ov/Coco Sharpnack re: mucositis ? p changed chewing gums Chief Complaint  Patient presents with  . Acute Visit    Pt c/o blisters in mouth and soreness in throat for the past wk.     Starting to improve since avoiding citrus gums. No fever, no odynophagia or unusual exposures  No obvious day to day or daytime variabilty or assoc chronic cough or cp or chest tightness, subjective wheeze overt sinus or hb symptoms. No unusual exp hx or h/o childhood pna/ asthma or knowledge of premature birth.  Sleeping ok without nocturnal  or early am exacerbation  of respiratory  c/o's or need for noct saba. Also denies any obvious fluctuation of symptoms with weather or environmental changes or other aggravating or alleviating factors except as  outlined above   Current Medications, Allergies, Complete Past Medical History, Past Surgical History, Family History, and Social History were reviewed in [employer] record.  ROS  The following are not active complaints unless bolded sore throat, dysphagia, dental problems, itching, sneezing,  nasal congestion or excess/ purulent secretions, ear ache,   fever, chills, sweats, unintended wt loss, pleuritic or exertional cp, hemoptysis,  orthopnea pnd or leg swelling, presyncope, palpitations, heartburn, abdominal pain, anorexia, nausea, vomiting, diarrhea  or change in bowel or urinary habits, change in stools or urine, dysuria,hematuria,  rash, arthralgias, visual complaints, headache, numbness weakness or ataxia or problems with walking or coordination,  change in mood/affect or memory.     .      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  has children  Patient states former smoker, light, quit 1993  Occ ETOH        Objective:   Physical Exam    amb wm nad  Wt 224 07/27/2011 > 01/19/2013  216 > 04/15/14 224     HEENT: nl dentition, turbinates,    external ear canals   ok without cough reflex Mild thrush/ mild erythema pos oropharanx s pnd or cobblestoning   NECK :  without JVD/Nodes/TM/ nl carotid upstrokes bilaterally   LUNGS: no acc muscle use, clear to A and P bilaterally without cough on insp or exp maneuvers   CV:  RRR  no s3 or murmur or increase in P2, no edema  DP > PT  ABD:  soft and nontender with nl excursion in the supine position. No bruits or organomegaly, bowel sounds nl  MS:  warm without deformities, calf tenderness, cyanosis or clubbing, neg slr bilaterally  SKIN: warm and dry without lesions           Assessment:

## 2014-04-16 ENCOUNTER — Telehealth: Payer: Self-pay | Admitting: Internal Medicine

## 2014-04-16 DIAGNOSIS — K1230 Oral mucositis (ulcerative), unspecified: Secondary | ICD-10-CM | POA: Insufficient documentation

## 2014-04-16 NOTE — Telephone Encounter (Signed)
They can pick the standard one they use

## 2014-04-16 NOTE — Telephone Encounter (Signed)
Called spoke with Diane. Gave VO for this. Nothing further needed

## 2014-04-16 NOTE — Assessment & Plan Note (Signed)
Completely non-specific findings, no risk factors for HIV, improving off citrus chewing gum ? Allergic reaction ? Could this be gerd related   For now rec rx with GERD diet which includes avoiding citrus products, and MMW > f/u ent prn

## 2014-04-16 NOTE — Telephone Encounter (Signed)
Called and spoke with the pharmacy and they stated that they have 5 different ratios of the MMW and wanted to know which ratio would MW like to call in for the pt.  IF this ratio does not matter, then the pharmacy will pick one for the pt.  MW please advise. thanks

## 2014-05-06 DIAGNOSIS — M7750 Other enthesopathy of unspecified foot: Secondary | ICD-10-CM

## 2014-05-17 ENCOUNTER — Telehealth: Payer: Self-pay | Admitting: Internal Medicine

## 2014-05-17 DIAGNOSIS — H7290 Unspecified perforation of tympanic membrane, unspecified ear: Secondary | ICD-10-CM

## 2014-05-17 NOTE — Telephone Encounter (Signed)
ATC PT line rang several times and no answer and no VM WCB 

## 2014-05-21 NOTE — Telephone Encounter (Signed)
Pt states that he was [place] hunting last week and was sitting in a whole in the ground. He states that when he took a shot he states it "rung his bell" and immediately he could not hear out of his right ear. He states that it sounds like he is holding a seashell up to his ear. He states when it happened he had ringing in his ears as well. He states it has improved some, but he is concerned that he did some damage. Please advise.[place] Bing, CMA

## 2014-05-21 NOTE — Telephone Encounter (Signed)
Pt is aware that referral will be made for ENT. Nothing further is needed.

## 2014-05-21 NOTE — Telephone Encounter (Signed)
ent consult/ whoever his insurance covers ? Rupture ear drum?

## 2014-06-12 ENCOUNTER — Encounter (INDEPENDENT_AMBULATORY_CARE_PROVIDER_SITE_OTHER): Payer: Self-pay

## 2014-06-12 ENCOUNTER — Other Ambulatory Visit (INDEPENDENT_AMBULATORY_CARE_PROVIDER_SITE_OTHER): Payer: Managed Care, Other (non HMO)

## 2014-06-12 ENCOUNTER — Ambulatory Visit (INDEPENDENT_AMBULATORY_CARE_PROVIDER_SITE_OTHER): Payer: Managed Care, Other (non HMO) | Admitting: Internal Medicine

## 2014-06-12 ENCOUNTER — Encounter: Payer: Self-pay | Admitting: Internal Medicine

## 2014-06-12 VITALS — BP 122/78 | HR 57 | Temp 98.0°F | Ht 75.5 in | Wt 227.2 lb

## 2014-06-12 DIAGNOSIS — E785 Hyperlipidemia, unspecified: Secondary | ICD-10-CM

## 2014-06-12 DIAGNOSIS — Z0000 Encounter for general adult medical examination without abnormal findings: Secondary | ICD-10-CM

## 2014-06-12 DIAGNOSIS — H9319 Tinnitus, unspecified ear: Secondary | ICD-10-CM

## 2014-06-12 LAB — URINALYSIS
BILIRUBIN URINE: NEGATIVE
Hgb urine dipstick: NEGATIVE
KETONES UR: NEGATIVE
Leukocytes, UA: NEGATIVE
Nitrite: NEGATIVE
SPECIFIC GRAVITY, URINE: 1.02 (ref 1.000–1.030)
Total Protein, Urine: NEGATIVE
Urine Glucose: NEGATIVE
Urobilinogen, UA: 0.2 (ref 0.0–1.0)
pH: 6 (ref 5.0–8.0)

## 2014-06-12 LAB — BASIC METABOLIC PANEL
BUN: 15 mg/dL (ref 6–23)
CO2: 28 mEq/L (ref 19–32)
Calcium: 9 mg/dL (ref 8.4–10.5)
Chloride: 108 mEq/L (ref 96–112)
Creatinine, Ser: 0.8 mg/dL (ref 0.4–1.5)
GFR: 108.92 mL/min (ref >60.00)
Glucose, Bld: 108 mg/dL — ABNORMAL HIGH (ref 70–99)
POTASSIUM: 4 meq/L (ref 3.5–5.1)
SODIUM: 140 meq/L (ref 135–145)

## 2014-06-12 LAB — CBC WITH DIFFERENTIAL/PLATELET
Basophils Absolute: 0 10*3/uL (ref 0.0–0.1)
Basophils Relative: 0.6 % (ref 0.0–3.0)
Eosinophils Absolute: 0.3 10*3/uL (ref 0.0–0.7)
Eosinophils Relative: 5.6 % — ABNORMAL HIGH (ref 0.0–5.0)
HCT: 42.4 % (ref 39.0–52.0)
HEMOGLOBIN: 14.6 g/dL (ref 13.0–17.0)
Lymphocytes Relative: 29.9 % (ref 12.0–46.0)
Lymphs Abs: 1.5 10*3/uL (ref 0.7–4.0)
MCHC: 34.5 g/dL (ref 30.0–36.0)
MCV: 89.7 fl (ref 78.0–100.0)
Monocytes Absolute: 0.4 10*3/uL (ref 0.1–1.0)
Monocytes Relative: 8.4 % (ref 3.0–12.0)
NEUTROS ABS: 2.8 10*3/uL (ref 1.4–7.7)
Neutrophils Relative %: 55.5 % (ref 43.0–77.0)
Platelets: 217 10*3/uL (ref 150.0–400.0)
RBC: 4.73 Mil/uL (ref 4.22–5.81)
RDW: 12.7 % (ref 11.5–15.5)
WBC: 5 10*3/uL (ref 4.0–10.5)

## 2014-06-12 LAB — LIPID PANEL
CHOL/HDL RATIO: 6
Cholesterol: 181 mg/dL (ref 0–200)
HDL: 32.8 mg/dL — ABNORMAL LOW (ref >39.00)
LDL Cholesterol: 108 mg/dL — ABNORMAL HIGH (ref 0–99)
NONHDL: 148.2
Triglycerides: 202 mg/dL — ABNORMAL HIGH (ref 0.0–149.0)
VLDL: 40.4 mg/dL — AB (ref 0.0–40.0)

## 2014-06-12 LAB — HEPATIC FUNCTION PANEL
ALBUMIN: 4.1 g/dL (ref 3.5–5.2)
ALT: 26 U/L (ref 0–53)
AST: 25 U/L (ref 0–37)
Alkaline Phosphatase: 66 U/L (ref 39–117)
Bilirubin, Direct: 0.1 mg/dL (ref 0.0–0.3)
Total Bilirubin: 0.5 mg/dL (ref 0.2–1.2)
Total Protein: 6.6 g/dL (ref 6.0–8.3)

## 2014-06-12 LAB — TSH: TSH: 4.83 u[IU]/mL — AB (ref 0.35–4.50)

## 2014-06-12 LAB — BRAIN NATRIURETIC PEPTIDE: PRO B NATRI PEPTIDE: 18 pg/mL (ref 0.0–100.0)

## 2014-06-12 NOTE — Assessment & Plan Note (Addendum)
-   pt declined PSA screening 01/20/12 and 06/12/2014  - added asa 81 mg daily 06/12/2014   Up to date on shots/colonoscopy

## 2014-06-12 NOTE — Assessment & Plan Note (Signed)
-   target LDL < 130 based on positive family history and remote smoking hx   tsh ok Lab Results  Component Value Date   CHOL 181 06/12/2014   HDL 32.80* 06/12/2014   LDLCALC 108* 06/12/2014   LDLDIRECT 138.7 01/19/2013   TRIG 202.0* 06/12/2014   CHOLHDL 6 06/12/2014    Adequate control on present rx, reviewed > no change in rx needed

## 2014-06-12 NOTE — Patient Instructions (Addendum)
Baby aspirin one daily (81 mg)  See the ear doctor to whom you've already been referred at your convenience  Please remember to go to the lab  department downstairs for your tests - we will call you with the results when they are available.  Return yearly, sooner if needed

## 2014-06-12 NOTE — Progress Notes (Signed)
Quick Note:  Spoke with pt and notified of results per Dr. [name]. Pt verbalized understanding and denied any questions.  ______ 
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
## 2014-06-12 NOTE — Progress Notes (Signed)
Subjective:     Patient ID: Joel Franco, male   DOB: 09/22/1956     MRN: [id number]    Brief patient profile:  72 yowm remote minimal smoker with chronic palpitations  that typically occur more at rest than they do with activity and not progressive, if anything less noticeable as ages.   History of Present Illness  June 12, 2009 cpx no urinary problems, overall doing fine except for problems staying asleep. rec sleep hygiene reviewed  07/27/2011 cpx/ [name] new cc low back pain p straining pulling lawnmower out of ditch 3 weeks ago, has etoldolac 500 mg only took once so far not sure it helped, no radicular features. rec Ok to leave off doxazosin as long as no problem urinating and blood pressure ok  Ok to use etoldolac 500 one twice daily with meals as needed for joint/ back pain - if not better in 2 weeks we need to refer you to a back specialist (orhopedic)  04/05/2012 f/u ov/[name] cc missed appt  08/24/2012 f/u ov/[name] cc missed appt  01/19/2013 CPX, [name], no cp or sob or cough or flare of palpitations  rec Please remember to go to the lab and x-ray department downstairs for your tests - we will call you with the results when they are available.    04/15/2014 acute ov/[name] re: mucositis ? p changed chewing gums Chief Complaint  Patient presents with  . Acute Visit    Pt c/o blisters in mouth and soreness in throat for the past wk.     Starting to improve since avoiding citrus gums. No fever, no odynophagia or unusual exposures rec Magic mouthwash 1-2 tsp four times daily as needed. gerd diet   06/12/2014 f/u ov/[name] re:  cpx Chief Complaint  Patient presents with  . Annual Exam    Pt fasting.  No co's today.    New c/o hearing changes =  right ringing ever disharged gun mid May 2015, no troulble hearing Not using mobic, maybe twice weekly advil No asa   No obvious day to day or daytime variabilty or assoc chronic cough or cp or chest tightness, subjective wheeze overt  sinus or hb symptoms. No unusual exp hx or h/o childhood pna/ asthma or knowledge of premature birth.  Sleeping ok without nocturnal  or early am exacerbation  of respiratory  c/o's or need for noct saba. Also denies any obvious fluctuation of symptoms with weather or environmental changes or other aggravating or alleviating factors except as outlined above   Current Medications, Allergies, Complete Past Medical History, Past Surgical History, Family History, and Social History were reviewed in [employer] record.  ROS  The following are not active complaints unless bolded sore throat, dysphagia, dental problems, itching, sneezing,  nasal congestion or excess/ purulent secretions, ear ache,   fever, chills, sweats, unintended wt loss, pleuritic or exertional cp, hemoptysis,  orthopnea pnd or leg swelling, presyncope, palpitations, heartburn, abdominal pain, anorexia, nausea, vomiting, diarrhea  or change in bowel or urinary habits, change in stools or urine, dysuria,hematuria,  rash, arthralgias, visual complaints, headache, numbness weakness or ataxia or problems with walking or coordination,  change in mood/affect or memory.     .      Past Medical History:  HYPERLIPIDEMIA (ICD-272.4)  - target LDL < 130 based on positive family history and remote smoking hx  CHRONIC RHINITIS (ICD-472.0)  PALPITATIONS, HX OF (ICD-V12.50)  DIVERTICULOSIS.............................................................................Marland KitchenSharlett Iles  - See colonoscopy 04/26/07  neck pain positive MRI  with right C6/7 protrusion ......................Marland Kitchen Dr. 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  has children  Patient states former smoker, light, quit 1993  Occ ETOH        Objective:   Physical Exam    amb wm nad  Wt 224 07/27/2011 > 01/19/2013  216 > 04/15/14 224   > 06/12/2014 227   HEENT: nl dentition, turbinates,    external ear canals  ok without cough reflex/ tms ok oropharanyx ok s pnd or cobblestoning   NECK :  without JVD/Nodes/TM/ nl carotid upstrokes bilaterally   LUNGS: no acc muscle use, clear to A and P bilaterally without cough on insp or exp maneuvers   CV:  RRR  no s3 or murmur or increase in P2, no edema  DP > PT  ABD:  soft and nontender with nl excursion in the supine position. No bruits or organomegaly, bowel sounds nl  MS:  warm without deformities, calf tenderness, cyanosis or clubbing, neg slr bilaterally  SKIN: warm and dry without lesions    GU testes down bilaterally, neg IH  Rectal smooth mild BPH, stool g neg       Recent Labs Lab 06/12/14 0939  NA 140  K 4.0  CL 108  CO2 28  BUN 15  CREATININE 0.8  GLUCOSE 108*    Recent Labs Lab 06/12/14 0939  HGB 14.6  HCT 42.4  WBC 5.0  PLT 217.0      Lab Results  Component Value Date   TSH 4.83* 06/12/2014       Assessment:

## 2014-09-10 ENCOUNTER — Telehealth: Payer: Self-pay | Admitting: Internal Medicine

## 2014-09-10 DIAGNOSIS — M79672 Pain in left foot: Secondary | ICD-10-CM

## 2014-09-10 NOTE — Telephone Encounter (Signed)
Spoke with the pt  He states that he is having some pain in his left foot- "joint in the toes hurt" He also has hx of plantar fascitis in his rt foot  Has been seeing ortho- they rec orthotics but his did not help  He is requesting referral to podiatry  Aware MW out of the office this wk  Please advise, thanks!

## 2014-09-11 NOTE — Telephone Encounter (Signed)
Order placed -- pt aware. PCCs, pt would like to try a different office other than [facility] if possible.  Please advise.  Thank you.

## 2014-09-11 NOTE — Telephone Encounter (Signed)
Contacted patient and advised patient of his choices of podiatrist. He requested Dr. Rosemary Holms. Contacted Dr. Roxana Hires office and secretary stated to fax the referral to [phone] and they would contact the patient on mobile number to arrange appointment with him. Advised secretary that once appointment has been scheduled, to please notify us of this appointment as well so we can document appointment.  Waiting on appointment. Rhonda J Cobb

## 2014-09-11 NOTE — Telephone Encounter (Signed)
Fine with me- use the one on/near [address]

## 2014-09-12 NOTE — Telephone Encounter (Signed)
Appointment scheduled with Dr. Rosemary Holms for Wed 09/18/14 at 9:20 am. [address] [place], [place]. Called patient and pt didn't have anything to write appointment down with. Pt requested that I call him back and leave appointment information on his mobile phone answering machine. Returned call and left the above information, date, time, location and phone number to [facility]. Advised patient that if he had any additional questions, to return my call at [phone]. Nothing else needed at this time. Rhonda J Cobb

## 2015-05-12 ENCOUNTER — Other Ambulatory Visit: Payer: Self-pay | Admitting: Internal Medicine

## 2015-05-21 ENCOUNTER — Telehealth: Payer: Self-pay | Admitting: Internal Medicine

## 2015-05-21 MED ORDER — DOXYCYCLINE HYCLATE 100 MG PO TABS: 100.0000 mg | ORAL_TABLET | Freq: Two times a day (BID) | ORAL | Status: DC

## 2015-05-21 NOTE — Telephone Encounter (Signed)
Called spoke with patient, discussed MW's recommendations.  Pt voiced his understanding and denied any further questions/concerns.  His insurance actually runs out on June 1 so will need to be seen 5/31.  MW with no openings that date so ov scheduled with TP for 1045.  Rx sent to verified pharmacy > [facility] on [address].  Verified with Magda Paganini - doxycycline 100mg  BID is for 7d.  Nothing further needed; will sign off.

## 2015-05-21 NOTE — Telephone Encounter (Signed)
Patient calling because he is concerned about tick bites x 3 weeks ago.  He found 2 ticks on his stomach near the groin area.  The spot is red and splotchy.  Patient feels very weak and fatigued.  Patient says that he has a change in insurance that doesn't kick in until June 1st, so he doesn't have insurance right now to come in to be seen.  Patient is not sure if he has had fever, he has not checked temperature.    MW - please advise.

## 2015-05-21 NOTE — Telephone Encounter (Signed)
Won't hurt to take doxycycline 100 mg bid before meals with a large glass of water and plan on ov p June 1st to regroup

## 2015-05-27 ENCOUNTER — Ambulatory Visit (INDEPENDENT_AMBULATORY_CARE_PROVIDER_SITE_OTHER): Payer: Managed Care, Other (non HMO) | Admitting: Adult Health

## 2015-05-27 ENCOUNTER — Encounter (INDEPENDENT_AMBULATORY_CARE_PROVIDER_SITE_OTHER): Payer: Self-pay

## 2015-05-27 ENCOUNTER — Encounter: Payer: Self-pay | Admitting: Adult Health

## 2015-05-27 VITALS — BP 108/68 | HR 69 | Temp 97.9°F | Ht 75.0 in | Wt 228.8 lb

## 2015-05-27 DIAGNOSIS — W57XXXA Bitten or stung by nonvenomous insect and other nonvenomous arthropods, initial encounter: Secondary | ICD-10-CM

## 2015-05-27 DIAGNOSIS — A6920 Lyme disease, unspecified: Secondary | ICD-10-CM | POA: Insufficient documentation

## 2015-05-27 DIAGNOSIS — T148 Other injury of unspecified body region: Secondary | ICD-10-CM

## 2015-05-27 MED ORDER — DOXYCYCLINE HYCLATE 100 MG PO TABS: 100.0000 mg | ORAL_TABLET | Freq: Two times a day (BID) | ORAL | Status: DC

## 2015-05-27 NOTE — Assessment & Plan Note (Signed)
?  Lyme Disease with tick exposure -unknown length of tick exposure but sound like EM  Was present along with some constitutional symptoms  Rash is resolving and pt is improved  Will check Lyme ab test  Extend abx for total 3 weeks , abx education   Plan  Extend Doxycycline for additional 2 weeks  Use sunscreen while taking -can cause sunburn  Take with food and full glass of water.  Lyme test today .  Eat yogurt and take probiotic while on antibiotic .  follow up Dr. Melvyn Novas  In 6 weeks and As needed   Please contact office for sooner follow up if symptoms do not improve or worsen or seek emergency care

## 2015-05-27 NOTE — Patient Instructions (Signed)
Extend Doxycycline for additional 2 weeks  Use sunscreen while taking -can cause sunburn  Take with food and full glass of water.  Lyme test today .  Eat yogurt and take probiotic while on antibiotic .  follow up Dr. Melvyn Novas  In 6 weeks and As needed   Please contact office for sooner follow up if symptoms do not improve or worsen or seek emergency care

## 2015-05-27 NOTE — Progress Notes (Signed)
Subjective:     Patient ID: Joel Franco, male   DOB: [birth year]     MRN: [id number]    Brief patient profile:  49 yowm remote minimal smoker with chronic palpitations  that typically occur more at rest than they do with activity and not progressive, if anything less noticeable as ages.   History of Present Illness  June 12, 2009 cpx no urinary problems, overall doing fine except for problems staying asleep. rec sleep hygiene reviewed  07/27/2011 cpx/ [name] new cc low back pain p straining pulling lawnmower out of ditch 3 weeks ago, has etoldolac 500 mg only took once so far not sure it helped, no radicular features. rec Ok to leave off doxazosin as long as no problem urinating and blood pressure ok  Ok to use etoldolac 500 one twice daily with meals as needed for joint/ back pain - if not better in 2 weeks we need to refer you to a back specialist (orhopedic)  04/05/2012 f/u ov/[name] cc missed appt  08/24/2012 f/u ov/[name] cc missed appt  01/19/2013 CPX, [name], no cp or sob or cough or flare of palpitations  rec Please remember to go to the lab and x-ray department downstairs for your tests - we will call you with the results when they are available.    04/15/2014 acute ov/[name] re: mucositis ? p changed chewing gums Chief Complaint  Patient presents with  . Acute Visit    Pt c/o blisters in mouth and soreness in throat for the past wk.     Starting to improve since avoiding citrus gums. No fever, no odynophagia or unusual exposures rec Magic mouthwash 1-2 tsp four times daily as needed. gerd diet   06/12/2014 f/u ov/[name] re:  cpx Chief Complaint  Patient presents with  . Annual Exam    Pt fasting.  No co's today.    New c/o hearing changes =  right ringing ever disharged gun mid May 2015, no troulble hearing Not using mobic, maybe twice weekly advil No asa  >no changes   05/27/2015 Acute OV -Tick bite Pt presents for tick bite.  Pt says approximately 3 weeks ago found tick  on stomach /along suprapubic area .  Scratched at something then saw tick reattach to skin.  Feels he had tick for 2-3 days attached. Had being feeling tired and weak for few days with intermittent nausea. Removed tick intact, not engorged but very small tick.  Noticed circular ring around both areas tick was attached.  Ring seemed to evolve over time and is now resolving  Was called in doxycycline x 7 days on 5/25.  He denies neck stiffness, fever, joint pain or swelling. No other rash other than at site of previous tick.  He is feeling improved since starting abx.  Does have nausea but seems related to taking abx on empty stomach.  Denies neuro symptoms .    Current Medications, Allergies, Complete Past Medical History, Past Surgical History, Family History, and Social History were reviewed in [employer] record.  ROS  The following are not active complaints unless bolded sore throat, dysphagia, dental problems, itching, sneezing,  nasal congestion or excess/ purulent secretions, ear ache,   fever, chills, sweats, unintended wt loss, pleuritic or exertional cp, hemoptysis,  orthopnea pnd or leg swelling, presyncope, palpitations, heartburn, abdominal pain, anorexia, nausea, vomiting, diarrhea  or change in bowel or urinary habits, change in stools or urine, dysuria,hematuria,  rash, arthralgias, visual complaints, headache, numbness weakness  or ataxia or problems with walking or coordination,  change in mood/affect or memory.     .      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 No bruits or organomegaly, bowel sounds nl  MS:  warm without deformities, calf tenderness, cyanosis or clubbing, neg slr bilaterally  SKIN: warm and dry , along lower mid abd/suprapubic region scattered erythematous patches   Neuro no focal deficits       Recent Labs Lab 06/12/14 0939  NA 140  K 4.0  CL 108  CO2 28  BUN 15  CREATININE 0.8  GLUCOSE 108*    Recent Labs Lab 06/12/14 0939  HGB 14.6  HCT 42.4  WBC 5.0  PLT 217.0      Lab Results  Component Value Date   TSH 4.83* 06/12/2014       Assessment:

## 2015-05-27 NOTE — Progress Notes (Signed)
Chart and office note reviewed in detail  > agree with a/p as outlined    

## 2015-06-23 ENCOUNTER — Encounter: Payer: Self-pay | Admitting: Internal Medicine

## 2015-06-23 ENCOUNTER — Ambulatory Visit (INDEPENDENT_AMBULATORY_CARE_PROVIDER_SITE_OTHER): Payer: 59 | Admitting: Internal Medicine

## 2015-06-23 ENCOUNTER — Telehealth: Payer: Self-pay | Admitting: Internal Medicine

## 2015-06-23 ENCOUNTER — Other Ambulatory Visit (INDEPENDENT_AMBULATORY_CARE_PROVIDER_SITE_OTHER): Payer: 59

## 2015-06-23 VITALS — BP 128/90 | HR 82 | Ht 75.0 in | Wt 227.0 lb

## 2015-06-23 DIAGNOSIS — M25569 Pain in unspecified knee: Secondary | ICD-10-CM

## 2015-06-23 DIAGNOSIS — R5382 Chronic fatigue, unspecified: Secondary | ICD-10-CM

## 2015-06-23 DIAGNOSIS — E038 Other specified hypothyroidism: Secondary | ICD-10-CM

## 2015-06-23 DIAGNOSIS — M25559 Pain in unspecified hip: Secondary | ICD-10-CM | POA: Diagnosis not present

## 2015-06-23 DIAGNOSIS — A6920 Lyme disease, unspecified: Secondary | ICD-10-CM

## 2015-06-23 DIAGNOSIS — R5383 Other fatigue: Secondary | ICD-10-CM | POA: Insufficient documentation

## 2015-06-23 LAB — CBC WITH DIFFERENTIAL/PLATELET
BASOS PCT: 0.8 % (ref 0.0–3.0)
Basophils Absolute: 0 10*3/uL (ref 0.0–0.1)
Eosinophils Absolute: 0.3 10*3/uL (ref 0.0–0.7)
Eosinophils Relative: 4.9 % (ref 0.0–5.0)
HCT: 44.8 % (ref 39.0–52.0)
Hemoglobin: 15.3 g/dL (ref 13.0–17.0)
Lymphocytes Relative: 24.6 % (ref 12.0–46.0)
Lymphs Abs: 1.4 10*3/uL (ref 0.7–4.0)
MCHC: 34.1 g/dL (ref 30.0–36.0)
MCV: 90 fl (ref 78.0–100.0)
MONO ABS: 0.5 10*3/uL (ref 0.1–1.0)
Monocytes Relative: 7.8 % (ref 3.0–12.0)
Neutro Abs: 3.6 10*3/uL (ref 1.4–7.7)
Neutrophils Relative %: 61.9 % (ref 43.0–77.0)
Platelets: 218 10*3/uL (ref 150.0–400.0)
RBC: 4.97 Mil/uL (ref 4.22–5.81)
RDW: 13 % (ref 11.5–15.5)
WBC: 5.8 10*3/uL (ref 4.0–10.5)

## 2015-06-23 NOTE — Telephone Encounter (Signed)
Probably not > needs f/u with me but no more abx for now

## 2015-06-23 NOTE — Patient Instructions (Addendum)
Please remember to go to the lab   department downstairs for your tests - we will call you with the results when they are available.  We will refer you to the infection disease department if there is any evidence of ongoing infection or you are not feeling better after the July 4th weekend

## 2015-06-23 NOTE — Progress Notes (Signed)
Subjective:     Patient ID: Joel Franco, male   DOB: 10-Dec-1956     MRN: [id number]    Brief patient profile:  29 yowm remote minimal smoker with chronic palpitations  that typically occur more at rest than they do with activity and not progressive, if anything less noticeable as ages.   History of Present Illness  June 12, 2009 cpx no urinary problems, overall doing fine except for problems staying asleep. rec sleep hygiene reviewed  07/27/2011 cpx/ Ghina Bittinger new cc low back pain p straining pulling lawnmower out of ditch 3 weeks ago, has etoldolac 500 mg only took once so far not sure it helped, no radicular features. rec Ok to leave off doxazosin as long as no problem urinating and blood pressure ok  Ok to use etoldolac 500 one twice daily with meals as needed for joint/ back pain - if not better in 2 weeks we need to refer you to a back specialist (orhopedic)  04/05/2012 f/u ov/Tia Hieronymus cc missed appt  08/24/2012 f/u ov/Maurisio Ruddy cc missed appt  01/19/2013 CPX, Otto Felkins, no cp or sob or cough or flare of palpitations  rec Please remember to go to the lab and x-ray department downstairs for your tests - we will call you with the results when they are available.    04/15/2014 acute ov/Jalissa Heinzelman re: mucositis ? p changed chewing gums Chief Complaint  Patient presents with  . Acute Visit    Pt c/o blisters in mouth and soreness in throat for the past wk.     Starting to improve since avoiding citrus gums. No fever, no odynophagia or unusual exposures rec Magic mouthwash 1-2 tsp four times daily as needed. gerd diet   06/12/2014 f/u ov/Tony Granquist re:  cpx Chief Complaint  Patient presents with  . Annual Exam    Pt fasting.  No co's today.    New c/o hearing changes =  right ringing ever disharged gun mid May 2015, no troulble hearing Not using mobic, maybe twice weekly advil No asa  >no changes   05/27/2015 NP  Acute OV -Tick bite Pt presents for tick bite.  Pt says approximately 3 weeks ago found  tick on stomach /along suprapubic area / not engorged  Scratched at something then saw tick reattach to skin.  Feels he had tick for 2-3 days attached. Had being feeling tired and weak for few days with intermittent nausea. Removed tick intact, not engorged but very small tick.  Noticed circular ring around both areas tick was attached.  Ring seemed to evolve over time and is now resolving  Was called in doxycycline x 7 days on 5/25.  He denies neck stiffness, fever, joint pain or swelling. No other rash other than at site of previous tick.  He is feeling improved since starting abx.  Does have nausea but seems related to taking abx on empty stomach.  Denies neuro symptoms .  rec Extend Doxycycline for additional 2 weeks  Use sunscreen while taking -can cause sunburn  Take with food and full glass of water.  Lyme test ordered, never sent     06/23/2015 f/u ov/Devron Cohick re: tick bite / fatigue / no pain but joints stiff / no ha  Chief Complaint  Patient presents with  . Acute Visit    Pt c/o joint stiffness- esp at night and first thing in the am for the past wk. He states also still feeling fatigue.    Not limited by breathing from desired activities  /  no sign rash   No obvious day to day or daytime variabilty or assoc chronic cough or cp or chest tightness, subjective wheeze overt sinus or hb symptoms. No unusual exp hx or h/o childhood pna/ asthma or knowledge of premature birth.  Sleeping ok without nocturnal  or early am exacerbation  of respiratory  c/o's or need for noct saba. Also denies any obvious fluctuation of symptoms with weather or environmental changes or other aggravating or alleviating factors except as outlined above   Current Medications, Allergies, Complete Past Medical History, Past Surgical History, Family History, and Social History were reviewed in [employer] record.  ROS  The following are not active complaints unless bolded sore throat,  dysphagia, dental problems, itching, sneezing,  nasal congestion or excess/ purulent secretions, ear ache,   fever, chills, sweats, unintended wt loss, pleuritic or exertional cp, hemoptysis,  orthopnea pnd or leg swelling, presyncope, palpitations, abdominal pain, anorexia, nausea, vomiting, diarrhea  or change in bowel or urinary habits, change in stools or urine, dysuria,hematuria,  rash, arthralgias, visual complaints, headache, numbness weakness or ataxia or problems with walking or coordination,  change in mood/affect or memory.          .      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  has children  Patient states former smoker, light, quit 1993  Occ ETOH        Objective:   Physical Exam    amb wm nad  Wt 224 07/27/2011 > 01/19/2013  216 > 04/15/14 224   > 06/12/2014 227 >228 05/27/2015 > 06/28/2015  227 HEENT: nl dentition, turbinates,    external ear canals nml oropharanyx ok    NECK :  without JVD/Nodes/TM/ nl carotid upstrokes bilaterally Neg nuchal rigidity    LUNGS: no acc muscle use, clear to A and P bilaterally without cough on insp or exp maneuvers   CV:  RRR   no s3 or murmur or increase in P2, no edema  DP > PT  ABD:  soft and nontender with nl excursion in the supine position. No bruits or organomegaly, bowel sounds nl  MS:  warm without deformities, calf tenderness, cyanosis or clubbing, neg slr bilaterally  SKIN: warm and dry , along lower mid abd/suprapubic region minimal scattered erythematous patches/ none are annular   Neuro no focal deficits    Labs ordered/ reviewed:    Lab 06/23/15 1719  HGB 15.3  HCT 44.8  WBC 5.8  PLT 218.0     Lab Results  Component Value Date   TSH 8.47* 06/23/2015     Lab Results  Component Value Date   PROBNP 18.0 06/12/2014     Lab Results  Component Value Date   ESRSEDRATE 16 06/23/2015  Assessment:

## 2015-06-23 NOTE — Telephone Encounter (Signed)
Pt scheduled for OV today at 4:15 with MW Will call back if unable to make it work with his job. Nothing further needed.

## 2015-06-23 NOTE — Telephone Encounter (Signed)
Spoke with pt. Was seen by TP on 05/27/15 for a tick bite. Finished antibiotic that was prescribed. He has been having increased fatigue and joint pain. Wondering if these are symptoms could be coming from the tick bite.  MW - please advise. Thanks.

## 2015-06-24 LAB — SEDIMENTATION RATE: Sed Rate: 16 mm/hr (ref 0–22)

## 2015-06-24 LAB — TSH: TSH: 8.47 u[IU]/mL — AB (ref 0.35–4.50)

## 2015-06-25 ENCOUNTER — Telehealth: Payer: Self-pay | Admitting: Internal Medicine

## 2015-06-25 MED ORDER — LEVOTHYROXINE SODIUM 50 MCG PO TABS: 50.0000 ug | ORAL_TABLET | Freq: Every day | ORAL | Status: DC

## 2015-06-25 NOTE — Progress Notes (Signed)
Quick Note:  LMTCB ______ 

## 2015-06-25 NOTE — Telephone Encounter (Signed)
LMTCB

## 2015-06-25 NOTE — Progress Notes (Signed)
Quick Note:  Pt returned call. Informed pt of the results and recs per MW. Rx sent to preferred pharmacy. Appt made with TP on 7/26. Pt verbalized understanding and denied any further questions or concerns at this time. ______

## 2015-06-25 NOTE — Telephone Encounter (Signed)
Pt returned call. Informed pt of the results and recs per MW. Rx sent to preferred pharmacy. Appt made with TP on 7/26. Pt verbalized understanding and denied any further questions or concerns at this time.    Notes Recorded by Tanda Rockers, MD on 06/24/2015 at 7:55 PM Call patient : Study is c/w hypothyrodism so rec strart synthroid 50 mcg per day and ov with Tammy in 4 weeks to recheck tsh

## 2015-06-28 DIAGNOSIS — E039 Hypothyroidism, unspecified: Secondary | ICD-10-CM | POA: Insufficient documentation

## 2015-06-28 DIAGNOSIS — G8929 Other chronic pain: Secondary | ICD-10-CM | POA: Insufficient documentation

## 2015-06-28 DIAGNOSIS — M25552 Pain in left hip: Secondary | ICD-10-CM

## 2015-06-28 DIAGNOSIS — M25561 Pain in right knee: Secondary | ICD-10-CM

## 2015-06-28 DIAGNOSIS — M25562 Pain in left knee: Secondary | ICD-10-CM

## 2015-06-28 DIAGNOSIS — M25551 Pain in right hip: Secondary | ICD-10-CM

## 2015-06-28 NOTE — Assessment & Plan Note (Signed)
Completed 3 week of doxy 06/11/15  Titer not sent as requested but not evidence of an active infection here > f/u ID prn

## 2015-06-28 NOTE — Assessment & Plan Note (Signed)
This was present  before the tick bites > try nsaids first/ rheum eval deferred

## 2015-06-28 NOTE — Assessment & Plan Note (Signed)
Synthroid 50 mcg per day started 06/25/15   Probably not clinically relevant although could explain some of the fatigue he's blaming on the tick bites.

## 2015-06-28 NOTE — Assessment & Plan Note (Signed)
ESR nl and stongly doubt lyme dz or any tick borne infection though apparently titer not sent as requested. If not feeling better p correct new dx of hypothyroidism will defer to ID

## 2015-07-08 ENCOUNTER — Ambulatory Visit: Payer: Managed Care, Other (non HMO) | Admitting: Internal Medicine

## 2015-07-22 ENCOUNTER — Ambulatory Visit (INDEPENDENT_AMBULATORY_CARE_PROVIDER_SITE_OTHER): Payer: 59 | Admitting: Adult Health

## 2015-07-22 ENCOUNTER — Other Ambulatory Visit (INDEPENDENT_AMBULATORY_CARE_PROVIDER_SITE_OTHER): Payer: 59

## 2015-07-22 ENCOUNTER — Telehealth: Payer: Self-pay

## 2015-07-22 ENCOUNTER — Encounter: Payer: Self-pay | Admitting: Adult Health

## 2015-07-22 VITALS — BP 144/86 | HR 60 | Temp 98.0°F | Ht 75.0 in | Wt 228.0 lb

## 2015-07-22 DIAGNOSIS — E038 Other specified hypothyroidism: Secondary | ICD-10-CM

## 2015-07-22 DIAGNOSIS — W57XXXA Bitten or stung by nonvenomous insect and other nonvenomous arthropods, initial encounter: Secondary | ICD-10-CM

## 2015-07-22 LAB — TSH: TSH: 6.62 u[IU]/mL — ABNORMAL HIGH (ref 0.35–4.50)

## 2015-07-22 MED ORDER — LEVOTHYROXINE SODIUM 75 MCG PO TABS: 75.0000 ug | ORAL_TABLET | Freq: Every day | ORAL | Status: DC

## 2015-07-22 NOTE — Patient Instructions (Signed)
Continue on current regimen  Labs today follow up Dr. Melvyn Novas  In 3 months and As needed

## 2015-07-22 NOTE — Assessment & Plan Note (Signed)
Improved symptoms on Synthroid  Check tsh today  follow up Dr. Melvyn Novas  In 3 months and As needed

## 2015-07-22 NOTE — Progress Notes (Signed)
Chart and office note reviewed in detail along with available   labs > agree with a/p as outlined  

## 2015-07-22 NOTE — Telephone Encounter (Signed)
Called and spoke with pt. Reviewed pt's TSH results per TP's note:  Notes Recorded by Melvenia Needles, NP on 07/22/2015 at 1:10 PM TSH is not improved enough yet, please increase Synthroid 71mcg #30 , 1 po daily , 5 refills Recheck in 3 months at ov with Dr. Rae Mar in rx to [facility] pharmacy in [place] Pt is aware  Nothing further needed.

## 2015-07-22 NOTE — Progress Notes (Signed)
Subjective:     Patient ID: AKIF WELDY, male   DOB: [birth year]     MRN: [id number]    Brief patient profile:  89 yowm remote minimal smoker with chronic palpitations  that typically occur more at rest than they do with activity and not progressive, if anything less noticeable as ages.   History of Present Illness  June 12, 2009 cpx no urinary problems, overall doing fine except for problems staying asleep. rec sleep hygiene reviewed  07/27/2011 cpx/ [name] new cc low back pain p straining pulling lawnmower out of ditch 3 weeks ago, has etoldolac 500 mg only took once so far not sure it helped, no radicular features. rec Ok to leave off doxazosin as long as no problem urinating and blood pressure ok  Ok to use etoldolac 500 one twice daily with meals as needed for joint/ back pain - if not better in 2 weeks we need to refer you to a back specialist (orhopedic)  04/05/2012 f/u ov/[name] cc missed appt  08/24/2012 f/u ov/[name] cc missed appt  01/19/2013 CPX, [name], no cp or sob or cough or flare of palpitations  rec Please remember to go to the lab and x-ray department downstairs for your tests - we will call you with the results when they are available.    04/15/2014 acute ov/[name] re: mucositis ? p changed chewing gums Chief Complaint  Patient presents with  . Acute Visit    Pt c/o blisters in mouth and soreness in throat for the past wk.     Starting to improve since avoiding citrus gums. No fever, no odynophagia or unusual exposures rec Magic mouthwash 1-2 tsp four times daily as needed. gerd diet   06/12/2014 f/u ov/[name] re:  cpx Chief Complaint  Patient presents with  . Annual Exam    Pt fasting.  No co's today.    New c/o hearing changes =  right ringing ever disharged gun mid May 2015, no troulble hearing Not using mobic, maybe twice weekly advil No asa  >no changes   05/27/2015 NP  Acute OV -Tick bite Pt presents for tick bite.  Pt says approximately 3 weeks ago found  tick on stomach /along suprapubic area / not engorged  Scratched at something then saw tick reattach to skin.  Feels he had tick for 2-3 days attached. Had being feeling tired and weak for few days with intermittent nausea. Removed tick intact, not engorged but very small tick.  Noticed circular ring around both areas tick was attached.  Ring seemed to evolve over time and is now resolving  Was called in doxycycline x 7 days on 5/25.  He denies neck stiffness, fever, joint pain or swelling. No other rash other than at site of previous tick.  He is feeling improved since starting abx.  Does have nausea but seems related to taking abx on empty stomach.  Denies neuro symptoms .  rec Extend Doxycycline for additional 2 weeks  Use sunscreen while taking -can cause sunburn  Take with food and full glass of water.  Lyme test ordered, never sent     06/23/2015 f/u ov/[name] re: tick bite / fatigue / no pain but joints stiff / no ha  Chief Complaint  Patient presents with  . Acute Visit    Pt c/o joint stiffness- esp at night and first thing in the am for the past wk. He states also still feeling fatigue.    Not limited by breathing from desired activities  /  no sign rash   07/22/2015 Follow up : Hypothyroid  Pt returns for evaluation .  Seen last ov with fatigue  Labs showed hypothyroidism with TSH ~8.47  He was started on Synthroid 11mcg.  He is starting to feel better. Less fatigue and joint pains.  Had tick bite with rash in May , given 3 weeks of Doxycycline for  Suspected Lymes dz . He did not go for labs as recommended in May  For Lyme titer. He denies muscle weakness, rash or neck stiffness  No speech issues.  No chest pain, orthopnea, edema or fever.  MW pt here for 4 week follow up. Pt is taking synthroid. Pt c/o fatigue and dry mouth. Pt states breathing overall is doing well.      Current Medications, Allergies, Complete Past Medical History, Past Surgical History, Family  History, and Social History were reviewed in [employer] record.  ROS  The following are not active complaints unless bolded sore throat, dysphagia, dental problems, itching, sneezing,  nasal congestion or excess/ purulent secretions, ear ache,   fever, chills, sweats, unintended wt loss, pleuritic or exertional cp, hemoptysis,  orthopnea pnd or leg swelling, presyncope, palpitations, abdominal pain, anorexia, nausea, vomiting, diarrhea  or change in bowel or urinary habits, change in stools or urine, dysuria,hematuria,  rash, arthralgias, visual complaints, headache, numbness weakness or ataxia or problems with walking or coordination,  change in mood/affect or memory.       .      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  has children  Patient states former smoker, light, quit 1993  Occ ETOH        Objective:   Physical Exam    amb wm nad  Wt 224 07/27/2011 > 01/19/2013  216 > 04/15/14 224   > 06/12/2014 227 >228 05/27/2015 > 06/28/2015  227>228 07/22/2015   HEENT: nl dentition, turbinates,    external ear canals nml oropharanyx ok    NECK :  without  JVD/Nodes/TM/ nl carotid upstrokes bilaterally Neg nuchal rigidity    LUNGS: no acc muscle use, clear to A and P bilaterally without cough on insp or exp maneuvers   CV:  RRR  no s3 or murmur or increase in P2, no edema  DP > PT  ABD:  soft and nontender with nl excursion in the supine position. No bruits or organomegaly, bowel sounds nl  MS:  warm without deformities, calf tenderness, cyanosis or clubbing, neg slr bilaterally  SKIN: warm and dry    Neuro no focal deficits    Labs ordered/ reviewed:    Lab 06/23/15 1719  HGB 15.3  HCT 44.8  WBC 5.8  PLT 218.0     Lab Results  Component Value Date   TSH 8.47* 06/23/2015     Lab Results  Component Value Date  PROBNP 18.0 06/12/2014     Lab Results  Component Value Date   ESRSEDRATE 16 06/23/2015                 Assessment:

## 2015-07-25 LAB — B. BURGDORFI ANTIBODIES BY WB
B BURGDORFERI IGG ABS (IB): NEGATIVE
B BURGDORFERI IGM ABS (IB): NEGATIVE

## 2015-09-03 ENCOUNTER — Telehealth: Payer: Self-pay | Admitting: Internal Medicine

## 2015-09-03 NOTE — Telephone Encounter (Signed)
Notes Recorded by Melvenia Needles, NP on 07/22/2015 at 1:10 PM TSH is not improved enough yet, please increase Synthroid 14mcg #30 , 1 po daily , 5 refills Recheck in 3 months at ov with Dr. Dory Peru and spoke with pt and he is aware that he will need to follow up with MW in October to have labs repeated.  He is aware to cont with the medication as prescribed.

## 2015-11-28 ENCOUNTER — Encounter: Payer: Self-pay | Admitting: Internal Medicine

## 2015-11-28 ENCOUNTER — Other Ambulatory Visit (INDEPENDENT_AMBULATORY_CARE_PROVIDER_SITE_OTHER): Payer: 59

## 2015-11-28 ENCOUNTER — Telehealth: Payer: Self-pay | Admitting: Internal Medicine

## 2015-11-28 ENCOUNTER — Ambulatory Visit (INDEPENDENT_AMBULATORY_CARE_PROVIDER_SITE_OTHER): Payer: 59 | Admitting: Internal Medicine

## 2015-11-28 VITALS — BP 118/88 | HR 52 | Ht 75.0 in | Wt 223.2 lb

## 2015-11-28 DIAGNOSIS — Z0000 Encounter for general adult medical examination without abnormal findings: Secondary | ICD-10-CM

## 2015-11-28 DIAGNOSIS — M25569 Pain in unspecified knee: Secondary | ICD-10-CM | POA: Diagnosis not present

## 2015-11-28 DIAGNOSIS — E039 Hypothyroidism, unspecified: Secondary | ICD-10-CM

## 2015-11-28 DIAGNOSIS — Z23 Encounter for immunization: Secondary | ICD-10-CM

## 2015-11-28 DIAGNOSIS — G4700 Insomnia, unspecified: Secondary | ICD-10-CM

## 2015-11-28 DIAGNOSIS — M25559 Pain in unspecified hip: Secondary | ICD-10-CM | POA: Diagnosis not present

## 2015-11-28 LAB — TSH: TSH: 3.72 u[IU]/mL (ref 0.35–4.50)

## 2015-11-28 LAB — SEDIMENTATION RATE: Sed Rate: 11 mm/hr (ref 0–22)

## 2015-11-28 MED ORDER — TRAZODONE HCL 50 MG PO TABS: ORAL_TABLET | ORAL | Status: DC

## 2015-11-28 NOTE — Addendum Note (Signed)
Addended by: Jerrol Banana on: 11/28/2015 09:55 AM   Modules accepted: Orders

## 2015-11-28 NOTE — Assessment & Plan Note (Signed)
Reviewed> due Tdap today and cpx p first of year

## 2015-11-28 NOTE — Telephone Encounter (Signed)
Pt calling with a few questions regarding his labs - states that he was advised to ask Dr Melvyn Novas a question about his thyroid... What relation the T4 and T3 values have in connection with the TSH panel? Pt is also out of thyroid meds and states that he was told by Dr Melvyn Novas to hold off on taking this/getting refilled until he got lab results back.  Dr Melvyn Novas, are these results available for review? thanks

## 2015-11-28 NOTE — Addendum Note (Signed)
Addended by: Jerrol Banana on: 11/28/2015 09:59 AM   Modules accepted: Orders

## 2015-11-28 NOTE — Patient Instructions (Addendum)
Tetanus shot today   Try trazodone 25 mg at bedtime   Please remember to go to the lab   department downstairs for your tests - we will call you with the results when they are available.    Please schedule a follow up office visit in 6 weeks, call sooner if needed for cpx  Late add esr with labs for cpx

## 2015-11-28 NOTE — Telephone Encounter (Signed)
The tsh is perfect now so no need to do t4 or t3 - there is a lot of misinformation floating around on the internet about this but 99% is false or misleading so just continue the rx as is and we'll regroup at cpx planned

## 2015-11-28 NOTE — Assessment & Plan Note (Signed)
Not using much nsaids at all, reviewed optimal rx is up to 3 with meals prn and if not well controlled > rheum eval next

## 2015-11-28 NOTE — Assessment & Plan Note (Signed)
Synthroid 50 mcg per day started 06/25/15 > increased to 75 mcg per day 09/03/15   Clinically euthyroid, tsh pending

## 2015-11-28 NOTE — Progress Notes (Signed)
Subjective:     Patient ID: Joel Franco, male   DOB: July 28, 1956     MRN: [id number]    Brief patient profile:  79 yowm remote minimal smoker with chronic palpitations  that typically occur more at rest than they do with activity and not progressive, if anything less noticeable as ages.   History of Present Illness  June 12, 2009 cpx no urinary problems, overall doing fine except for problems staying asleep. rec sleep hygiene reviewed  05/27/2015 NP  Acute OV -Tick bite Pt presents for tick bite.  Pt says approximately 3 weeks ago found tick on stomach /along suprapubic area / not engorged  Scratched at something then saw tick reattach to skin.  Feels he had tick for 2-3 days attached. Had being feeling tired and weak for few days with intermittent nausea. Removed tick intact, not engorged but very small tick.  Noticed circular ring around both areas tick was attached.  Ring seemed to evolve over time and is now resolving  Was called in doxycycline x 7 days on 5/25.  He denies neck stiffness, fever, joint pain or swelling. No other rash other than at site of previous tick.  He is feeling improved since starting abx.  Does have nausea but seems related to taking abx on empty stomach.  Denies neuro symptoms .  rec Extend Doxycycline for additional 2 weeks  Use sunscreen while taking -can cause sunburn  Take with food and full glass of water.  Lyme test ordered, never sent     06/23/2015 f/u ov/Joel Franco re: tick bite / fatigue / no pain but joints stiff / no ha  Chief Complaint  Patient presents with  . Acute Visit    Pt c/o joint stiffness- esp at night and first thing in the am for the past wk. He states also still feeling fatigue.    Not limited by breathing from desired activities  / no sign rash   07/22/2015 Follow up : Hypothyroid  Pt returns for evaluation .  Seen last ov with fatigue  Labs showed hypothyroidism with TSH ~8.47  He was started on Synthroid 29mcg.  He is  starting to feel better. Less fatigue and joint pains.  Had tick bite with rash in May , given 3 weeks of Doxycycline for  Suspected Lymes dz . He did not go for labs as recommended in May  For Lyme titer. He denies muscle weakness, rash or neck stiffness  No speech issues.  No chest pain, orthopnea, edema or fever.  MW pt here for 4 week follow up. Pt is taking synthroid. Pt c/o fatigue and dry mouth.  rec Increase tsh to 75 mcg daily    11/28/2015  f/u ov/Joel Franco re: hypothyroid Chief Complaint  Patient presents with  . Follow-up    Pt c/o restless sleep, insomnia and chronic fatigue. Pt's wife would like to have him checked for sleep apnea. Pt states that his symptoms have been increasingly worse since the tick bite earlier this year.   arthritis better,  Twice a week advil most of the time  Falls asleep fine but wakes up in 2-3 hours then reads x up to 30 min and after sev hours but does not feel refreshed in am  Better p benadryl but feels hungover   No   chronic cough or cp or chest tightness, subjective wheeze or overt sinus or hb symptoms. No unusual exp hx or h/o childhood pna/ asthma or knowledge of premature birth.  Sleeping ok without nocturnal  or early am exacerbation  of respiratory  c/o's or need for noct saba. Also denies any obvious fluctuation of symptoms with weather or environmental changes or other aggravating or alleviating factors except as outlined above   Current Medications, Allergies, Complete Past Medical History, Past Surgical History, Family History, and Social History were reviewed in [employer] record.  ROS  The following are not active complaints unless bolded sore throat, dysphagia, dental problems, itching, sneezing,  nasal congestion or excess/ purulent secretions, ear ache,   fever, chills, sweats, unintended wt loss, classically pleuritic or exertional cp, hemoptysis,  orthopnea pnd or leg swelling, presyncope, palpitations,  abdominal pain, anorexia, nausea, vomiting, diarrhea  or change in bowel or bladder habits, change in stools or urine, dysuria,hematuria,  rash, arthralgias, visual complaints, headache, numbness, weakness or ataxia or problems with walking or coordination,  change in mood/affect or memory.                  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  has children  Patient states former smoker, light, quit 1993  Occ ETOH        Objective:   Physical Exam    amb wm nad  Wt 224 07/27/2011 > 01/19/2013  216 > 04/15/14 224   > 06/12/2014 227 >228 05/27/2015 > 06/28/2015  227>228 07/22/2015 > 11/28/2015  223   HEENT: nl dentition, turbinates,    external ear canals nml Oropharynx  ok    NECK :  without JVD/Nodes/TM/ nl carotid upstrokes bilaterally Neg nuchal rigidity    LUNGS: no acc muscle use, clear to A and P bilaterally without cough on insp or exp maneuvers   CV:  RRR  no s3 or murmur or increase in P2, no edema  DP > PT  ABD:  soft and nontender with nl excursion in the supine position. No bruits or organomegaly, bowel sounds nl  MS:  warm without deformities, calf  tenderness, cyanosis or clubbing, neg slr bilaterally  SKIN: warm and dry    Neuro no focal deficits      Labs ordered 11/28/2015  = tsh                Assessment:

## 2015-11-28 NOTE — Telephone Encounter (Signed)
LMTCB x 1 

## 2015-11-28 NOTE — Assessment & Plan Note (Signed)
Chronic problem never tried trazadone > try 25 mg per day and f/u in 6 weeks  I had an extended discussion with the patient reviewing all relevant studies completed to date and  lasting 15 to 20 minutes of a 25 minute visit    Each maintenance medication was reviewed in detail including most importantly the difference between maintenance and prns and under what circumstances the prns are to be triggered using an action plan format that is not reflected in the computer generated alphabetically organized AVS.    Please see instructions for details which were reviewed in writing and the patient given a copy highlighting the part that I personally wrote and discussed at today's ov.

## 2015-11-28 NOTE — Telephone Encounter (Signed)
lmomtcb x1 

## 2015-12-01 NOTE — Telephone Encounter (Signed)
Patient notified of Dr. Gustavus Bryant recommendations. Verbalized understanding. Nothing further needed. Closing encounter

## 2015-12-09 ENCOUNTER — Telehealth: Payer: Self-pay | Admitting: Internal Medicine

## 2015-12-09 NOTE — Telephone Encounter (Signed)
It's probably the trazadone causing the problems, not the thyroid.  rec leave the trazadone off for a week, continue the thyroid and if mouth still dry needs ov, if better and still having sleep issues try one half the dose of trazadone to see if that won't work > nothing further over the phone, ov needed

## 2015-12-09 NOTE — Telephone Encounter (Signed)
lmtcb x1 for pt. 

## 2015-12-09 NOTE — Telephone Encounter (Signed)
Patient says Levothyroxine is causing him to be very dehydrated, dry mouth.  Trazodone is not helping with his sleep, he feels drowsy the next day, so he stopped taking it.    Allergies  Allergen Reactions  . Amoxicillin

## 2015-12-10 NOTE — Telephone Encounter (Signed)
LMTCB x2 for pt 

## 2015-12-11 NOTE — Telephone Encounter (Signed)
LMTCB x3 for pt.  

## 2015-12-12 NOTE — Telephone Encounter (Signed)
lmtcb x4 for pt. Per triage protocol, message will be closed. Another message will need to be started if/when the pt calls back.

## 2016-01-09 ENCOUNTER — Ambulatory Visit: Payer: 59 | Admitting: Internal Medicine

## 2016-04-07 ENCOUNTER — Telehealth: Payer: Self-pay | Admitting: Internal Medicine

## 2016-04-07 MED ORDER — LEVOTHYROXINE SODIUM 75 MCG PO TABS: 75.0000 ug | ORAL_TABLET | Freq: Every day | ORAL | Status: DC

## 2016-04-07 NOTE — Telephone Encounter (Signed)
Rx for Levothyroxine sent to pharmacy. Called and advised patient via voicemail that RX has been sent to pharmacy and that he will need to contact our office to schedule follow up for further refills. Nothing further needed.

## 2016-05-20 ENCOUNTER — Telehealth: Payer: Self-pay | Admitting: Internal Medicine

## 2016-05-20 MED ORDER — LEVOTHYROXINE SODIUM 75 MCG PO TABS
75.0000 ug | ORAL_TABLET | Freq: Every day | ORAL | Status: DC
Start: 2016-05-20 — End: 2016-05-25

## 2016-05-20 NOTE — Telephone Encounter (Signed)
Last seen 12.2.16 by MW: Patient Instructions       Tetanus shot today   Try trazodone 25 mg at bedtime   Please remember to go to the lab   department downstairs for your tests - we will call you with the results when they are available.     Please schedule a follow up office visit in 6 weeks, call sooner if needed for cpx   Late add esr with labs for cpx   Called spoke with patient's spouse, verified medication strength and pharmacy Pt is scheduled for ov w/ MW on 5.30.17 and advised spouse that pt must keep this appt for additional refills - she voiced her understanding Rx sent Nothing further needed; will sign off

## 2016-05-25 ENCOUNTER — Encounter: Payer: Self-pay | Admitting: Internal Medicine

## 2016-05-25 ENCOUNTER — Other Ambulatory Visit: Payer: Self-pay | Admitting: Internal Medicine

## 2016-05-25 ENCOUNTER — Encounter (INDEPENDENT_AMBULATORY_CARE_PROVIDER_SITE_OTHER): Payer: Self-pay

## 2016-05-25 ENCOUNTER — Other Ambulatory Visit (INDEPENDENT_AMBULATORY_CARE_PROVIDER_SITE_OTHER): Payer: 59

## 2016-05-25 ENCOUNTER — Ambulatory Visit (INDEPENDENT_AMBULATORY_CARE_PROVIDER_SITE_OTHER): Payer: 59 | Admitting: Internal Medicine

## 2016-05-25 VITALS — BP 120/82 | HR 60 | Ht 75.0 in | Wt 228.0 lb

## 2016-05-25 DIAGNOSIS — E038 Other specified hypothyroidism: Secondary | ICD-10-CM | POA: Diagnosis not present

## 2016-05-25 DIAGNOSIS — Z0000 Encounter for general adult medical examination without abnormal findings: Secondary | ICD-10-CM

## 2016-05-25 DIAGNOSIS — M25569 Pain in unspecified knee: Secondary | ICD-10-CM

## 2016-05-25 DIAGNOSIS — M25559 Pain in unspecified hip: Secondary | ICD-10-CM | POA: Diagnosis not present

## 2016-05-25 LAB — URINALYSIS, ROUTINE W REFLEX MICROSCOPIC
BILIRUBIN URINE: NEGATIVE
HGB URINE DIPSTICK: NEGATIVE
KETONES UR: NEGATIVE
NITRITE: NEGATIVE
PH: 6.5 (ref 5.0–8.0)
Specific Gravity, Urine: 1.015 (ref 1.000–1.030)
Total Protein, Urine: NEGATIVE
UROBILINOGEN UA: 0.2 (ref 0.0–1.0)
Urine Glucose: NEGATIVE

## 2016-05-25 LAB — LIPID PANEL
CHOL/HDL RATIO: 6
CHOLESTEROL: 189 mg/dL (ref 0–200)
HDL: 32.2 mg/dL — ABNORMAL LOW (ref >39.00)
LDL CALC: 117 mg/dL — AB (ref 0–99)
NonHDL: 156.68
TRIGLYCERIDES: 196 mg/dL — AB (ref 0.0–149.0)
VLDL: 39.2 mg/dL (ref 0.0–40.0)

## 2016-05-25 LAB — BASIC METABOLIC PANEL
BUN: 17 mg/dL (ref 6–23)
CO2: 25 meq/L (ref 19–32)
Calcium: 9.7 mg/dL (ref 8.4–10.5)
Chloride: 106 mEq/L (ref 96–112)
Creatinine, Ser: 0.76 mg/dL (ref 0.40–1.50)
GFR: 111.47 mL/min (ref >60.00)
GLUCOSE: 101 mg/dL — AB (ref 70–99)
POTASSIUM: 4.1 meq/L (ref 3.5–5.1)
Sodium: 138 mEq/L (ref 135–145)

## 2016-05-25 LAB — CBC WITH DIFFERENTIAL/PLATELET
Basophils Absolute: 0 10*3/uL (ref 0.0–0.1)
Basophils Relative: 0.6 % (ref 0.0–3.0)
EOS ABS: 0.4 10*3/uL (ref 0.0–0.7)
EOS PCT: 6.4 % — AB (ref 0.0–5.0)
HCT: 43.7 % (ref 39.0–52.0)
Hemoglobin: 14.9 g/dL (ref 13.0–17.0)
Lymphocytes Relative: 24 % (ref 12.0–46.0)
Lymphs Abs: 1.4 10*3/uL (ref 0.7–4.0)
MCHC: 34.1 g/dL (ref 30.0–36.0)
MCV: 87.1 fl (ref 78.0–100.0)
MONO ABS: 0.5 10*3/uL (ref 0.1–1.0)
MONOS PCT: 9.5 % (ref 3.0–12.0)
NEUTROS ABS: 3.4 10*3/uL (ref 1.4–7.7)
Neutrophils Relative %: 59.5 % (ref 43.0–77.0)
PLATELETS: 240 10*3/uL (ref 150.0–400.0)
RBC: 5.02 Mil/uL (ref 4.22–5.81)
RDW: 13.3 % (ref 11.5–15.5)
WBC: 5.7 10*3/uL (ref 4.0–10.5)

## 2016-05-25 LAB — HEPATIC FUNCTION PANEL
ALT: 21 U/L (ref 0–53)
AST: 19 U/L (ref 0–37)
Albumin: 4.2 g/dL (ref 3.5–5.2)
Alkaline Phosphatase: 64 U/L (ref 39–117)
BILIRUBIN DIRECT: 0 mg/dL (ref 0.0–0.3)
BILIRUBIN TOTAL: 0.5 mg/dL (ref 0.2–1.2)
Total Protein: 6.9 g/dL (ref 6.0–8.3)

## 2016-05-25 LAB — TSH: TSH: 4.83 u[IU]/mL — AB (ref 0.35–4.50)

## 2016-05-25 MED ORDER — LEVOTHYROXINE SODIUM 100 MCG PO TABS: 100.0000 ug | ORAL_TABLET | Freq: Every day | ORAL | Status: DC

## 2016-05-25 MED ORDER — LEVOTHYROXINE SODIUM 75 MCG PO TABS: 75.0000 ug | ORAL_TABLET | Freq: Every day | ORAL | Status: DC

## 2016-05-25 NOTE — Patient Instructions (Signed)
Please remember to go to the lab   department downstairs for your tests - we will call you with the results when they are available.  Please schedule a follow up visit in 6 months but call sooner if needed     

## 2016-05-25 NOTE — Progress Notes (Signed)
Quick Note:  Spoke with pt and notified of results per Dr. [name]. Pt verbalized understanding and denied any questions.  ______ 

## 2016-05-25 NOTE — Progress Notes (Signed)
Subjective:     Patient ID: Joel Franco, male   DOB: 1956-10-21     MRN: [id number]    Brief patient profile:  46  yowm remote minimal smoker with chronic palpitations that typically occur more at rest than they do with activity and not progressive, if anything less noticeable as ages.   History of Present Illness  June 12, 2009 cpx no urinary problems, overall doing fine except for problems staying asleep. rec sleep hygiene reviewed  05/27/2015 NP  Acute OV -Tick bite Pt presents for tick bite.  Pt says approximately 3 weeks ago found tick on stomach /along suprapubic area / not engorged  Scratched at something then saw tick reattach to skin.  Feels he had tick for 2-3 days attached. Had being feeling tired and weak for few days with intermittent nausea. Removed tick intact, not engorged but very small tick.  Noticed circular ring around both areas tick was attached.  Ring seemed to evolve over time and is now resolving  Was called in doxycycline x 7 days on 5/25.  He denies neck stiffness, fever, joint pain or swelling. No other rash other than at site of previous tick.  He is feeling improved since starting abx.  Does have nausea but seems related to taking abx on empty stomach.  Denies neuro symptoms .  rec Extend Doxycycline for additional 2 weeks  Use sunscreen while taking -can cause sunburn  Take with food and full glass of water.  Lyme test ordered, never sent     06/23/2015 f/u ov/Joel Franco re: tick bite / fatigue / no pain but joints stiff / no ha  Chief Complaint  Patient presents with  . Acute Visit    Pt c/o joint stiffness- esp at night and first thing in the am for the past wk. He states also still feeling fatigue.    Not limited by breathing from desired activities  / no sign rash   07/22/2015 Follow up : Hypothyroid  Pt returns for evaluation .  Seen last ov with fatigue  Labs showed hypothyroidism with TSH ~8.47  He was started on Synthroid 75mcg.  He is  starting to feel better. Less fatigue and joint pains.  Had tick bite with rash in May , given 3 weeks of Doxycycline for  Suspected Lymes dz . He did not go for labs as recommended in May  For Lyme titer. He denies muscle weakness, rash or neck stiffness  No speech issues.  No chest pain, orthopnea, edema or fever.  MW pt here for 4 week follow up. Pt is taking synthroid. Pt c/o fatigue and dry mouth.  rec Increase tsh to 75 mcg daily    11/28/2015  f/u ov/Joel Franco re: hypothyroid Chief Complaint  Patient presents with  . Follow-up    Pt c/o restless sleep, insomnia and chronic fatigue. Pt's wife would like to have him checked for sleep apnea. Pt states that his symptoms have been increasingly worse since the tick bite earlier this year.   arthritis better,  Twice a week advil most of the time  Falls asleep fine but wakes up in 2-3 hours then reads x up to 30 min and after sev hours but does not feel refreshed in am  Better p benadryl but feels hungover  rec Tetanus shot today  Try trazodone 25 mg at bedtime    11/28/15 >  05/25/2016  Lots of phone calls/ lmtcb re trazadone side effects/ thyroid dose.   05/25/2016  f/u ov/Joel Franco re: cpx  Chief Complaint  Patient presents with  . Annual Exam    Pt is fasting today. He states injured his left knee approx 6 wks ago-seeing [facility]. No other new co's today.   limited by R knee   No  chronic cough or cp or chest tightness, subjective wheeze or overt sinus or hb symptoms. No unusual exp hx or h/o childhood pna/ asthma or knowledge of premature birth.  Sleeping ok without nocturnal  or early am exacerbation  of respiratory  c/o's or need for noct saba. Also denies any obvious fluctuation of symptoms with weather or environmental changes or other aggravating or alleviating factors except as outlined above   Current Medications, Allergies, Complete Past Medical History, Past Surgical History, Family History, and Social History were reviewed in  [employer] record.  ROS  The following are not active complaints unless bolded sore throat, dysphagia, dental problems, itching, sneezing,  nasal congestion or excess/ purulent secretions, ear ache,   fever, chills, sweats, unintended wt loss, classically pleuritic or exertional cp, hemoptysis,  orthopnea pnd or leg swelling, presyncope, palpitations, abdominal pain, anorexia, nausea, vomiting, diarrhea  or change in bowel or bladder habits, change in stools or urine, dysuria,hematuria,  rash, arthralgias, visual complaints, headache, numbness, weakness or ataxia or problems with walking or coordination,  change in mood/affect or memory.          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  has children  Patient states former smoker, light, quit 1993  Occ ETOH        Objective:   Physical Exam    amb wm nad/ vital signs reviewed   Wt 224 07/27/2011 > 01/19/2013  216 > 04/15/14 224   > 06/12/2014 227 >228 05/27/2015 > 06/28/2015  227>228 07/22/2015 > 11/28/2015  223 > 05/25/2016  228  HEENT: nl dentition, turbinates,    external ear canals dry wax impacted  bilaterally  Oropharynx  ok    NECK :  without JVD/Nodes/TM/ nl carotid upstrokes bilaterally Neg nuchal rigidity    LUNGS: no acc muscle use, clear to A and P bilaterally without cough on insp or exp maneuvers   CV:  RRR  no s3 or murmur or increase in P2, no edema  DP > PT  ABD:  soft and nontender with nl excursion in the supine position. No bruits or organomegaly, bowel sounds nl  MS:  warm without deformities, calf tenderness, cyanosis or clubbing, neg slr bilaterally  SKIN: warm and dry  - half dollar sized lipoma bottom of L scapula   Neuro no focal deficits   GU circ/ testes down bilaterally/ no nodule  Rectal: min bph smooth texture/ no nodules/ stool g Neg   Labs ordered/ reviewed:      Chemistry      Component Value  Date/Time   NA 138 05/25/2016 0929   K 4.1 05/25/2016 0929   CL 106 05/25/2016 0929   CO2 25 05/25/2016 0929   BUN 17 05/25/2016 0929   CREATININE 0.76 05/25/2016 0929      Component Value Date/Time   CALCIUM 9.7 05/25/2016 0929   ALKPHOS 64 05/25/2016 0929   AST 19 05/25/2016 0929   ALT 21 05/25/2016 0929   BILITOT 0.5 05/25/2016 0929        Lab Results  Component Value Date   WBC 5.7 05/25/2016   HGB 14.9 05/25/2016   HCT 43.7 05/25/2016   MCV 87.1 05/25/2016   PLT 240.0 05/25/2016        Lab Results  Component Value Date   TSH 4.83* 05/25/2016     Lab Results  Component Value Date   PROBNP 18.0 06/12/2014       Lab Results  Component Value Date   ESRSEDRATE 11 11/28/2015   ESRSEDRATE 16 06/23/2015                         Assessment:

## 2016-05-29 NOTE — Assessment & Plan Note (Signed)
-   pt declined PSA screening 01/20/12 and 06/12/2014 and 05/25/2016  - added asa 81 mg daily 06/12/2014   upd on all vaccinations/ health maint rx

## 2016-05-29 NOTE — Assessment & Plan Note (Signed)
Synthroid 50 mcg per day started 06/25/15 > increased to 75 mcg per day 09/03/15  - TSH 05/25/2016  = 4.8 on 75 mcg per day > increased to 100 mcg per day

## 2016-05-29 NOTE — Assessment & Plan Note (Signed)
F/u [facility] planned

## 2016-06-15 ENCOUNTER — Telehealth: Payer: Self-pay | Admitting: Internal Medicine

## 2016-06-15 DIAGNOSIS — R300 Dysuria: Secondary | ICD-10-CM

## 2016-06-15 DIAGNOSIS — F528 Other sexual dysfunction not due to a substance or known physiological condition: Secondary | ICD-10-CM

## 2016-06-15 MED ORDER — CIPROFLOXACIN HCL 500 MG PO TABS: ORAL_TABLET | ORAL | Status: DC

## 2016-06-15 NOTE — Telephone Encounter (Signed)
Patient notified of Dr. Gustavus Bryant recommendations.  Rx sent to pharmacy.  Urology referral placed.  Urinalysis ordered.  Patient states that he will come by the office tomorrow to collect UA sample.  Advised him to do UA sample before starting Cipro.  Patient verbalized understanding.  Nothing further needed.

## 2016-06-15 NOTE — Telephone Encounter (Signed)
Spoke with patient, states that he was noticed recently that his urine flow has been slower than normal and now he has developed a burning sensation from mid penis to the tip. Started 1.5 months ago and progressively getting worse. Pt states that his back has also been hurting - very uncomfortable. Pt states that he drank a lot of water and then laid down. Some improvement since drinking water but not 100%. Pt is concerned that his prostate may be inflamed or he may have an infection. Pt states that pain is worse with urination but there is a constant achiness present all day. Pt states that at times he feels that he is having to force the urine. Pt notes some right flank pain and pain in lower middle of back. Denies rash or discharge. Pt denies starting any new medications in the last few months. Pt also notes that after intercourse the same pain is present. Pt has seen [facility] in the past but is not an established patient currently. Please advise Dr Melvyn Novas. Thanks.

## 2016-06-15 NOTE — Telephone Encounter (Signed)
Have him drop by for u/a and rx for cipro 500 one half daily x 7 days and make appt for urology f/u unless they can go ahead and see him this week

## 2016-06-16 ENCOUNTER — Telehealth: Payer: Self-pay | Admitting: Internal Medicine

## 2016-06-16 ENCOUNTER — Other Ambulatory Visit (INDEPENDENT_AMBULATORY_CARE_PROVIDER_SITE_OTHER): Payer: 59

## 2016-06-16 DIAGNOSIS — R300 Dysuria: Secondary | ICD-10-CM

## 2016-06-16 DIAGNOSIS — F528 Other sexual dysfunction not due to a substance or known physiological condition: Secondary | ICD-10-CM | POA: Diagnosis not present

## 2016-06-16 LAB — URINALYSIS, ROUTINE W REFLEX MICROSCOPIC
Bilirubin Urine: NEGATIVE
Ketones, ur: NEGATIVE
Leukocytes, UA: NEGATIVE
Nitrite: NEGATIVE
PH: 5.5 (ref 5.0–8.0)
SPECIFIC GRAVITY, URINE: 1.01 (ref 1.000–1.030)
Total Protein, Urine: NEGATIVE
Urine Glucose: NEGATIVE
Urobilinogen, UA: 0.2 (ref 0.0–1.0)

## 2016-06-16 NOTE — Telephone Encounter (Signed)
Results have been explained to patient, pt expressed understanding. Nothing further needed.  Notes Recorded by Tanda Rockers, MD on 06/16/2016 at 1:23 PM Call patient : Study is unremarkable, no change in recs

## 2016-06-17 NOTE — Progress Notes (Signed)
Quick Note:  Spoke with pt and notified of results per Dr. [name]. Pt verbalized understanding and denied any questions.  ______ 

## 2016-07-08 ENCOUNTER — Telehealth: Payer: Self-pay | Admitting: Internal Medicine

## 2016-07-08 NOTE — Telephone Encounter (Signed)
We received a request for records from [facility] on 07/06/16 I faxed 21 pages to them

## 2016-07-13 ENCOUNTER — Telehealth: Payer: Self-pay | Admitting: Internal Medicine

## 2016-07-13 NOTE — Telephone Encounter (Signed)
Spoke with pt. States that he has an appointment with [facility] tomorrow. Reports that he is not having any issues with burning with urination but he is having issues with his flow. Wanted to know if he should keep this appointment with urology. Advised since he is having flow issues, I would keep the appointment. He agreed and verbalized understanding. Nothing further was needed.

## 2016-09-27 ENCOUNTER — Telehealth: Payer: Self-pay | Admitting: Internal Medicine

## 2016-09-27 MED ORDER — LEVOTHYROXINE SODIUM 100 MCG PO TABS: 100.0000 ug | ORAL_TABLET | Freq: Every day | ORAL | 2 refills | Status: DC

## 2016-09-27 NOTE — Telephone Encounter (Signed)
Pt would like Synthroid refill sent to [facility], [address], [place], [name]  Rx sent. Pt advised. Nothing further needed.

## 2016-09-27 NOTE — Telephone Encounter (Signed)
Pt calling with address where he needs rx to go [address] in [facility] phone # is [phone].Joel Franco

## 2016-09-27 NOTE — Telephone Encounter (Signed)
Spoke with the pt  He is going to check and see what pharm he wants this to be sent to and call back before 5 today

## 2016-11-25 ENCOUNTER — Other Ambulatory Visit (INDEPENDENT_AMBULATORY_CARE_PROVIDER_SITE_OTHER): Payer: 59

## 2016-11-25 ENCOUNTER — Encounter: Payer: Self-pay | Admitting: Internal Medicine

## 2016-11-25 ENCOUNTER — Ambulatory Visit (INDEPENDENT_AMBULATORY_CARE_PROVIDER_SITE_OTHER): Payer: 59 | Admitting: Internal Medicine

## 2016-11-25 VITALS — BP 118/80 | HR 56 | Ht 75.0 in | Wt 228.0 lb

## 2016-11-25 DIAGNOSIS — R3911 Hesitancy of micturition: Secondary | ICD-10-CM | POA: Diagnosis not present

## 2016-11-25 DIAGNOSIS — E038 Other specified hypothyroidism: Secondary | ICD-10-CM

## 2016-11-25 LAB — TSH: TSH: 3.02 u[IU]/mL (ref 0.35–4.50)

## 2016-11-25 NOTE — Progress Notes (Signed)
Subjective:     Patient ID: Joel Franco, male   DOB: 1956-10-21     MRN: [id number]    Brief patient profile:  46  yowm remote minimal smoker with chronic palpitations that typically occur more at rest than they do with activity and not progressive, if anything less noticeable as ages.   History of Present Illness  June 12, 2009 cpx no urinary problems, overall doing fine except for problems staying asleep. rec sleep hygiene reviewed  05/27/2015 NP  Acute OV -Tick bite Pt presents for tick bite.  Pt says approximately 3 weeks ago found tick on stomach /along suprapubic area / not engorged  Scratched at something then saw tick reattach to skin.  Feels he had tick for 2-3 days attached. Had being feeling tired and weak for few days with intermittent nausea. Removed tick intact, not engorged but very small tick.  Noticed circular ring around both areas tick was attached.  Ring seemed to evolve over time and is now resolving  Was called in doxycycline x 7 days on 5/25.  He denies neck stiffness, fever, joint pain or swelling. No other rash other than at site of previous tick.  He is feeling improved since starting abx.  Does have nausea but seems related to taking abx on empty stomach.  Denies neuro symptoms .  rec Extend Doxycycline for additional 2 weeks  Use sunscreen while taking -can cause sunburn  Take with food and full glass of water.  Lyme test ordered, never sent     06/23/2015 f/u ov/Joel Franco re: tick bite / fatigue / no pain but joints stiff / no ha  Chief Complaint  Patient presents with  . Acute Visit    Pt c/o joint stiffness- esp at night and first thing in the am for the past wk. He states also still feeling fatigue.    Not limited by breathing from desired activities  / no sign rash   07/22/2015 Follow up : Hypothyroid  Pt returns for evaluation .  Seen last ov with fatigue  Labs showed hypothyroidism with TSH ~8.47  He was started on Synthroid 75mcg.  He is  starting to feel better. Less fatigue and joint pains.  Had tick bite with rash in May , given 3 weeks of Doxycycline for  Suspected Lymes dz . He did not go for labs as recommended in May  For Lyme titer. He denies muscle weakness, rash or neck stiffness  No speech issues.  No chest pain, orthopnea, edema or fever.  MW pt here for 4 week follow up. Pt is taking synthroid. Pt c/o fatigue and dry mouth.  rec Increase tsh to 75 mcg daily    11/28/2015  f/u ov/Joel Franco re: hypothyroid Chief Complaint  Patient presents with  . Follow-up    Pt c/o restless sleep, insomnia and chronic fatigue. Pt's wife would like to have him checked for sleep apnea. Pt states that his symptoms have been increasingly worse since the tick bite earlier this year.   arthritis better,  Twice a week advil most of the time  Falls asleep fine but wakes up in 2-3 hours then reads x up to 30 min and after sev hours but does not feel refreshed in am  Better p benadryl but feels hungover  rec Tetanus shot today  Try trazodone 25 mg at bedtime    11/28/15 >  05/25/2016  Lots of phone calls/ lmtcb re trazadone side effects/ thyroid dose.   05/25/2016  f/u ov/Joel Franco re: cpx  Chief Complaint  Patient presents with  . Annual Exam    Pt is fasting today. He states injured his left knee approx 6 wks ago-seeing [facility]. No other new co's today.   limited by R knee  rec Increased synthroid to 100   11/25/2016  f/u ov/Joel Franco re:  Hypothyroid / urinary hesitance/ no maint meds from urology yet  Chief Complaint  Patient presents with  . Follow-up    denies new co's    R knee surgery set for Dec 13th 2017 Dr Veverly Fells / min need for advil/ taking fish oil for joints  Variable decrease fos despite rx with cipro by urology    No  chronic cough or cp or chest tightness, subjective wheeze or overt sinus or hb symptoms. No unusual exp hx or h/o childhood pna/ asthma or knowledge of premature birth.  Sleeping ok without nocturnal   or early am exacerbation  of respiratory  c/o's or need for noct saba. Also denies any obvious fluctuation of symptoms with weather or environmental changes or other aggravating or alleviating factors except as outlined above   Current Medications, Allergies, Complete Past Medical History, Past Surgical History, Family History, and Social History were reviewed in [employer] record.  ROS  The following are not active complaints unless bolded sore throat, dysphagia, dental problems, itching, sneezing,  nasal congestion or excess/ purulent secretions, ear ache,   fever, chills, sweats, unintended wt loss, classically pleuritic or exertional cp, hemoptysis,  orthopnea pnd or leg swelling, presyncope, palpitations, abdominal pain, anorexia, nausea, vomiting, diarrhea  or change in bowel or bladder habits, change in stools or urine, dysuria,hematuria,  rash, arthralgias, visual complaints, headache, numbness, weakness or ataxia or problems with walking or coordination,  change in mood/affect or memory.          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  has  children  Patient states former smoker, light, quit 1993  Occ ETOH        Objective:   Physical Exam    amb wm nad/ vital signs reviewed   Wt 224 07/27/2011 > 01/19/2013  216 > 04/15/14 224   > 06/12/2014 227 >228 05/27/2015 > 06/28/2015  227>228 07/22/2015 > 11/28/2015  223 > 05/25/2016  228  HEENT: nl dentition, turbinates,   Oropharynx  ok    NECK :  without JVD/Nodes/TM/ nl carotid upstrokes bilaterally Neg nuchal rigidity    LUNGS: no acc muscle use, clear to A and P bilaterally without cough on insp or exp maneuvers   CV:  RRR  no s3 or murmur or increase in P2, no edema  DP > PT  ABD:  soft and nontender with nl excursion in the supine position. No bruits or organomegaly, bowel sounds nl  MS:  warm without deformities,  calf tenderness, cyanosis or clubbing, neg slr bilaterally  SKIN: warm and dry  - half dollar sized lipoma bottom of L scapula   Neuro no focal deficits                   Lab Results  Component Value Date   TSH 3.02 11/25/2016                        Assessment:

## 2016-11-25 NOTE — Assessment & Plan Note (Signed)
Synthroid 50 mcg per day started 06/25/15 > increased to 75 mcg per day 09/03/15  - TSH 05/25/2016  = 4.8 on 75 mcg per day > increased to 100 mcg per day > 11/25/2016 =3.02  Adequate control on present rx, reviewed in detail with pt > no change in rx needed

## 2016-11-25 NOTE — Assessment & Plan Note (Signed)
Referred to urology 07/14/16   Advised to communicate directly with urology re options for rx with risk of uti/ renal insufficiency reviewed

## 2016-11-25 NOTE — Patient Instructions (Addendum)
Please remember to go to the lab  department downstairs for your tests - we will call you with the results when they are available.   Please schedule a follow up visit in 6 months but call sooner if needed for CPX

## 2016-11-26 NOTE — Progress Notes (Signed)
lmtcb

## 2016-11-29 NOTE — Progress Notes (Signed)
Spoke with pt and notified of results per Dr. [name]. Pt verbalized understanding and denied any questions. 

## 2016-12-31 ENCOUNTER — Other Ambulatory Visit: Payer: Self-pay | Admitting: Internal Medicine

## 2016-12-31 MED ORDER — LEVOTHYROXINE SODIUM 100 MCG PO TABS
100.0000 ug | ORAL_TABLET | Freq: Every day | ORAL | 1 refills | Status: DC
Start: 2016-12-31 — End: 2017-03-24

## 2017-03-08 ENCOUNTER — Telehealth: Payer: Self-pay | Admitting: Internal Medicine

## 2017-03-08 NOTE — Telephone Encounter (Signed)
This is very large dose to stop and it takes a while to feel the effects but if wants to stay off and feels fine in the meantime then we can check him out again at cpx

## 2017-03-08 NOTE — Telephone Encounter (Signed)
Spoke with pt, states he needs a refill on his synthroid but is unsure if he needs it- pt states he has been off of it approx 1 month and states he cannot tell a difference since he stopped taking the medication.  Pt wants to know if he needs to continue taking this medication, or if he can come off of it. Pt is scheduled for cpx on 03/24/17.    MW please advise.  Thanks!   Below is 11/25/16 AVS:  Instructions  Please remember to go to the lab  department downstairs for your tests - we will call you with the results when they are available.     Please schedule a follow up visit in 6 months but call sooner if needed for CPX

## 2017-03-08 NOTE — Telephone Encounter (Signed)
Spoke with pt, aware of recs.  Verified appt on 3/29.  Nothing further needed.

## 2017-03-17 ENCOUNTER — Encounter: Payer: 59 | Admitting: Internal Medicine

## 2017-03-24 ENCOUNTER — Ambulatory Visit (INDEPENDENT_AMBULATORY_CARE_PROVIDER_SITE_OTHER): Payer: 59 | Admitting: Internal Medicine

## 2017-03-24 ENCOUNTER — Encounter: Payer: Self-pay | Admitting: Internal Medicine

## 2017-03-24 VITALS — BP 142/80 | HR 54 | Ht 75.0 in | Wt 232.4 lb

## 2017-03-24 DIAGNOSIS — E038 Other specified hypothyroidism: Secondary | ICD-10-CM

## 2017-03-24 NOTE — Patient Instructions (Signed)
If worse constipation, fatigue, wt gain then restart your thyroid medication befor bfast   Follow up around 1st June 2018 for CPX

## 2017-03-24 NOTE — Progress Notes (Signed)
Subjective:     Patient ID: Joel Franco, male   DOB: 1956-10-21     MRN: [id number]    Brief patient profile:  46  yowm remote minimal smoker with chronic palpitations that typically occur more at rest than they do with activity and not progressive, if anything less noticeable as ages.   History of Present Illness  June 12, 2009 cpx no urinary problems, overall doing fine except for problems staying asleep. rec sleep hygiene reviewed  05/27/2015 NP  Acute OV -Tick bite Pt presents for tick bite.  Pt says approximately 3 weeks ago found tick on stomach /along suprapubic area / not engorged  Scratched at something then saw tick reattach to skin.  Feels he had tick for 2-3 days attached. Had being feeling tired and weak for few days with intermittent nausea. Removed tick intact, not engorged but very small tick.  Noticed circular ring around both areas tick was attached.  Ring seemed to evolve over time and is now resolving  Was called in doxycycline x 7 days on 5/25.  He denies neck stiffness, fever, joint pain or swelling. No other rash other than at site of previous tick.  He is feeling improved since starting abx.  Does have nausea but seems related to taking abx on empty stomach.  Denies neuro symptoms .  rec Extend Doxycycline for additional 2 weeks  Use sunscreen while taking -can cause sunburn  Take with food and full glass of water.  Lyme test ordered, never sent     06/23/2015 f/u ov/Ezekiel Menzer re: tick bite / fatigue / no pain but joints stiff / no ha  Chief Complaint  Patient presents with  . Acute Visit    Pt c/o joint stiffness- esp at night and first thing in the am for the past wk. He states also still feeling fatigue.    Not limited by breathing from desired activities  / no sign rash   07/22/2015 Follow up : Hypothyroid  Pt returns for evaluation .  Seen last ov with fatigue  Labs showed hypothyroidism with TSH ~8.47  He was started on Synthroid 75mcg.  He is  starting to feel better. Less fatigue and joint pains.  Had tick bite with rash in May , given 3 weeks of Doxycycline for  Suspected Lymes dz . He did not go for labs as recommended in May  For Lyme titer. He denies muscle weakness, rash or neck stiffness  No speech issues.  No chest pain, orthopnea, edema or fever.  MW pt here for 4 week follow up. Pt is taking synthroid. Pt c/o fatigue and dry mouth.  rec Increase tsh to 75 mcg daily    11/28/2015  f/u ov/Madoline Bhatt re: hypothyroid Chief Complaint  Patient presents with  . Follow-up    Pt c/o restless sleep, insomnia and chronic fatigue. Pt's wife would like to have him checked for sleep apnea. Pt states that his symptoms have been increasingly worse since the tick bite earlier this year.   arthritis better,  Twice a week advil most of the time  Falls asleep fine but wakes up in 2-3 hours then reads x up to 30 min and after sev hours but does not feel refreshed in am  Better p benadryl but feels hungover  rec Tetanus shot today  Try trazodone 25 mg at bedtime    11/28/15 >  05/25/2016  Lots of phone calls/ lmtcb re trazadone side effects/ thyroid dose.   05/25/2016  f/u ov/Jashon Ishida re: cpx  Chief Complaint  Patient presents with  . Annual Exam    Pt is fasting today. He states injured his left knee approx 6 wks ago-seeing [facility]. No other new co's today.   limited by R knee  rec Increased synthroid to 100   11/25/2016  f/u ov/Master Touchet re:  Hypothyroid / urinary hesitance/ no maint meds from urology yet  Chief Complaint  Patient presents with  . Follow-up    denies new co's  R knee surgery set for Dec 13th 2017 Dr Veverly Fells / min need for advil/ taking fish oil for joints  Variable decrease fos despite rx with cipro by urology rec f/u urology yearly due 06/2017    03/24/2017  f/u ov/Kobey Sides re:  Chief Complaint  Patient presents with  . Follow-up    Stopped synthroid 3-4 wks ago b/c he ran out and states he felt fine. He does c/o  constipation, started "forever and a day ago".    mild fatigue, wt gain, constipation but these were chronic issues before stopped the synthroid   Not limited by breathing from desired activities    Variable decrease fos/ f/u by Ottelin but no notes in epic   ROS                Past Medical History:  HYPERLIPIDEMIA (ICD-272.4)  - target LDL < 130 based on positive family history and remote smoking hx  CHRONIC RHINITIS (ICD-472.0)  PALPITATIONS, HX OF (ICD-V12.50)  DIVERTICULOSIS........................................................................Marland Kitchen     [facility]  - See colonoscopy 04/26/07  neck pain positive MRI with right C6/7 protrusion ......................Marland Kitchen      Dr. Ninfa Linden  Urinary Hestiance/ decrease FOS 2017 ........................................... 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  has children  Patient states former smoker, light, quit 1993  Occ ETOH        Objective:   Physical Exam    amb wm nad/ vital signs reviewed   Wt 224 07/27/2011 > 01/19/2013  216 > 04/15/14 224   > 06/12/2014 227 >228 05/27/2015 > 06/28/2015  227>228 07/22/2015 > 11/28/2015  223 > 05/25/2016  228 >  03/24/2017  232   HEENT: nl dentition, turbinates,   Oropharynx  ok   NECK :  without JVD/Nodes/TM/ nl carotid upstrokes bilaterally   LUNGS: no acc muscle use, clear to A and P bilaterally without cough on insp or exp maneuvers   CV:  RRR  no s3 or murmur or increase in P2, no edema  DP > PT  ABD:  soft and nontender with nl excursion in the  supine position. No bruits or organomegaly, bowel sounds nl  MS:  warm without deformities, calf tenderness, cyanosis or clubbing,   SKIN: warm and dry    Neuro no focal deficits / nl dtrs                    Assessment:

## 2017-03-24 NOTE — Assessment & Plan Note (Signed)
Synthroid 50 mcg per day started 06/25/15 > increased to 75 mcg per day 09/03/15  - TSH 05/25/2016  = 4.8 on 75 mcg per day > increased to 100 mcg per day > 11/25/2016 =3.02  - stopped synthroid about 02/24/17 > seen 03/24/2017 and euthyroid clinically so ok to leave off unless symptoms   Discussed in detail all the  indications, usual  risks and alternatives  relative to the benefits with patient who agrees to proceed with trial off synthroid

## 2017-05-27 ENCOUNTER — Telehealth: Payer: Self-pay | Admitting: Internal Medicine

## 2017-05-27 ENCOUNTER — Other Ambulatory Visit: Payer: Self-pay | Admitting: Internal Medicine

## 2017-05-27 ENCOUNTER — Encounter: Payer: Self-pay | Admitting: Internal Medicine

## 2017-05-27 ENCOUNTER — Other Ambulatory Visit (INDEPENDENT_AMBULATORY_CARE_PROVIDER_SITE_OTHER): Payer: 59

## 2017-05-27 ENCOUNTER — Encounter: Payer: Self-pay | Admitting: Gastroenterology

## 2017-05-27 ENCOUNTER — Ambulatory Visit (INDEPENDENT_AMBULATORY_CARE_PROVIDER_SITE_OTHER): Payer: 59 | Admitting: Internal Medicine

## 2017-05-27 VITALS — BP 140/86 | HR 60 | Ht 75.0 in | Wt 233.6 lb

## 2017-05-27 DIAGNOSIS — K5730 Diverticulosis of large intestine without perforation or abscess without bleeding: Secondary | ICD-10-CM

## 2017-05-27 DIAGNOSIS — E039 Hypothyroidism, unspecified: Secondary | ICD-10-CM | POA: Diagnosis not present

## 2017-05-27 DIAGNOSIS — K5790 Diverticulosis of intestine, part unspecified, without perforation or abscess without bleeding: Secondary | ICD-10-CM | POA: Insufficient documentation

## 2017-05-27 DIAGNOSIS — Z0000 Encounter for general adult medical examination without abnormal findings: Secondary | ICD-10-CM

## 2017-05-27 LAB — LIPID PANEL
CHOL/HDL RATIO: 5
Cholesterol: 167 mg/dL (ref 0–200)
HDL: 31.7 mg/dL — ABNORMAL LOW (ref >39.00)
LDL Cholesterol: 100 mg/dL — ABNORMAL HIGH (ref 0–99)
NONHDL: 134.91
Triglycerides: 175 mg/dL — ABNORMAL HIGH (ref 0.0–149.0)
VLDL: 35 mg/dL (ref 0.0–40.0)

## 2017-05-27 LAB — HEPATIC FUNCTION PANEL
ALBUMIN: 4.1 g/dL (ref 3.5–5.2)
ALK PHOS: 71 U/L (ref 39–117)
ALT: 28 U/L (ref 0–53)
AST: 24 U/L (ref 0–37)
BILIRUBIN DIRECT: 0 mg/dL (ref 0.0–0.3)
TOTAL PROTEIN: 6.6 g/dL (ref 6.0–8.3)
Total Bilirubin: 0.4 mg/dL (ref 0.2–1.2)

## 2017-05-27 LAB — CBC WITH DIFFERENTIAL/PLATELET
BASOS ABS: 0 10*3/uL (ref 0.0–0.1)
BASOS PCT: 1 % (ref 0.0–3.0)
EOS ABS: 0.3 10*3/uL (ref 0.0–0.7)
Eosinophils Relative: 5.6 % — ABNORMAL HIGH (ref 0.0–5.0)
HCT: 42.4 % (ref 39.0–52.0)
Hemoglobin: 14.9 g/dL (ref 13.0–17.0)
LYMPHS ABS: 1.3 10*3/uL (ref 0.7–4.0)
Lymphocytes Relative: 26.3 % (ref 12.0–46.0)
MCHC: 35.2 g/dL (ref 30.0–36.0)
MCV: 90 fl (ref 78.0–100.0)
Monocytes Absolute: 0.5 10*3/uL (ref 0.1–1.0)
Monocytes Relative: 9.8 % (ref 3.0–12.0)
NEUTROS ABS: 2.7 10*3/uL (ref 1.4–7.7)
NEUTROS PCT: 57.3 % (ref 43.0–77.0)
PLATELETS: 208 10*3/uL (ref 150.0–400.0)
RBC: 4.71 Mil/uL (ref 4.22–5.81)
RDW: 12.8 % (ref 11.5–15.5)
WBC: 4.8 10*3/uL (ref 4.0–10.5)

## 2017-05-27 LAB — BASIC METABOLIC PANEL
BUN: 17 mg/dL (ref 6–23)
CALCIUM: 9 mg/dL (ref 8.4–10.5)
CHLORIDE: 107 meq/L (ref 96–112)
CO2: 27 meq/L (ref 19–32)
CREATININE: 0.79 mg/dL (ref 0.40–1.50)
GFR: 106.24 mL/min (ref >60.00)
GLUCOSE: 106 mg/dL — AB (ref 70–99)
Potassium: 4.3 mEq/L (ref 3.5–5.1)
Sodium: 140 mEq/L (ref 135–145)

## 2017-05-27 LAB — TSH: TSH: 12.21 u[IU]/mL — ABNORMAL HIGH (ref 0.35–4.50)

## 2017-05-27 MED ORDER — LEVOTHYROXINE SODIUM 100 MCG PO CAPS: 1.0000 | ORAL_CAPSULE | Freq: Every day | ORAL | 1 refills | Status: DC

## 2017-05-27 NOTE — Telephone Encounter (Signed)
LM for patient x 1  Notes recorded by Rosana Berger, CMA on 05/27/2017 at 2:08 PM EDT Spoke with pt and notified of results per Dr. Melvyn Novas. Pt verbalized understanding and denied any questions. ------ Notes recorded by Tanda Rockers, MD on 05/27/2017 at 1:12 PM EDT Call patient : Studies are unremarkable except thyroid is now definitely underperforming and it may well be we never to this fully corrected and that's why the fatigue so rec resume thryoid at prev dose and recheck in 6 weeks with target of getting tsh < 2 this time (never got it less than 3 before ) Or alternative is see thryoid specialist for 2nd opinion

## 2017-05-27 NOTE — Patient Instructions (Addendum)
If Dr Karsten Ro doesn't call you before 07/27/17 for follow up then you should call him   Please see patient coordinator before you leave today  to schedule GI eval/ colonoscopy due  Advil can be taken up to 3-4 with meals as needed for joint pain  Please remember to go to the lab department downstairs in the basement  for your tests - we will call you with the results when they are available.     Please schedule a follow up visit in 6 months but call sooner if needed Add:  tsh 12 so started back on 100 mcg / day and f/u in 6 weeks with goal tsh < 2

## 2017-05-27 NOTE — Progress Notes (Signed)
Pt aware his cxr and his EKG were normal per MW

## 2017-05-27 NOTE — Progress Notes (Signed)
Subjective:     Patient ID: Joel Franco, male   DOB: 01/25/1956     MRN: [id number]    Brief patient profile:  37 yowm remote minimal smoker with chronic palpitations that typically occur more at rest than they do with activity and not progressive, if anything less noticeable as he has aged .   History of Present Illness  June 12, 2009 cpx no urinary problems, overall doing fine except for problems staying asleep. rec sleep hygiene reviewed    07/22/2015 NP Follow up : Hypothyroid  Pt returns for evaluation .  Seen last ov with fatigue  Labs showed hypothyroidism with TSH ~8.47  He was started on Synthroid 68mcg.  He is starting to feel better. Less fatigue and joint pains.  Had tick bite with rash in May , given 3 weeks of Doxycycline for  Suspected Lymes dz . He did not go for labs as recommended in May  For Lyme titer. He denies muscle weakness, rash or neck stiffness  No speech issues.  No chest pain, orthopnea, edema or fever.  MW pt here for 4 week follow up. Pt is taking synthroid. Pt c/o fatigue and dry mouth.  rec Increase tsh to 75 mcg daily    11/28/2015  f/u ov/[name] re: hypothyroid Chief Complaint  Patient presents with  . Follow-up    Pt c/o restless sleep, insomnia and chronic fatigue. Pt's wife would like to have him checked for sleep apnea. Pt states that his symptoms have been increasingly worse since the tick bite earlier this year.   arthritis better,  Twice a week advil most of the time  Falls asleep fine but wakes up in 2-3 hours then reads x up to 30 min and after sev hours but does not feel refreshed in am  Better p benadryl but feels hungover  rec Tetanus shot today  Try trazodone 25 mg at bedtime    11/28/15 >  05/25/2016  Lots of phone calls/ lmtcb re trazadone side effects/ thyroid dose.   05/25/2016  f/u ov/[name] re: cpx  Chief Complaint  Patient presents with  . Annual Exam    Pt is fasting today. He states injured his left knee approx 6 wks  ago-seeing [facility]. No other new co's today.   limited by R knee  rec Increased synthroid to 100   11/25/2016  f/u ov/[name] re:  Hypothyroid / urinary hesitance/ no maint meds from urology yet  Chief Complaint  Patient presents with  . Follow-up    denies new co's  R knee surgery set for Dec 13th 2017 Dr Veverly Fells / min need for advil/ taking fish oil for joints  Variable decrease fos despite rx with cipro by urology rec f/u urology yearly due 06/2017    03/24/2017  f/u ov/[name] re:  Chief Complaint  Patient presents with  . Follow-up    Stopped synthroid 3-4 wks ago b/c he ran out and states he felt fine. He does c/o constipation, started "forever and a day ago".    mild fatigue, wt gain, constipation but these were chronic issues before stopped the synthroid  Not limited by breathing from desired activities   Variable decrease fos/ f/u by Ottelin but no notes in epic     05/27/2017  f/u ov/[name] re: hypothyroid / urinary hesitance  Chief Complaint  Patient presents with  . Annual Exam    Pt is fasting.  Still has constipation that comes and goes. He states that he  feels very tired all of the time.    due for f/u urology 06/2017 Overdue for colonoscopy 03/2007 last study   Not limited by breathing from desired activities   Main complaint is fatigue ? Worse off synthroid (he denies)    No obvious day to day or daytime variability or assoc excess/ purulent sputum or mucus plugs or hemoptysis or cp or chest tightness, subjective wheeze or overt sinus or hb symptoms. No unusual exp hx or h/o childhood pna/ asthma or knowledge of premature birth.  Sleeping ok without nocturnal  or early am exacerbation  of respiratory  c/o's or need for noct saba. Also denies any obvious fluctuation of symptoms with weather or environmental changes or other aggravating or alleviating factors except as outlined above   Current Medications, Allergies, Complete Past Medical History, Past Surgical History,  Family History, and Social History were reviewed in [employer] record.  ROS  The following are not active complaints unless bolded sore throat, dysphagia, dental problems, itching, sneezing,  nasal congestion or excess/ purulent secretions, ear ache,   fever, chills, sweats, unintended wt loss, classically pleuritic or exertional cp,  orthopnea pnd or leg swelling, presyncope, palpitations, abdominal pain, anorexia, nausea, vomiting, diarrhea  or change in bowel or bladder habits= variable FOS, change in stools or urine, dysuria,hematuria,  rash, arthralgias better, visual complaints, headache, numbness, weakness or ataxia or problems with walking or coordination,  change in mood/affect or memory.                       Past Medical History:  HYPERLIPIDEMIA (ICD-272.4)  - target LDL < 130 based on positive family history and remote smoking hx  CHRONIC RHINITIS (ICD-472.0)  PALPITATIONS, HX OF (ICD-V12.50)  DIVERTICULOSIS........................................................................Marland Kitchen     [facility] (was [name])  - See colonoscopy 04/26/07  neck pain positive MRI with right C6/7 protrusion ......................Marland Kitchen      Dr. Ninfa Linden  Urinary Hestiance/ decrease FOS 2017 .........................................  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 Nl external ear canals without cough reflex   NECK :  without JVD/Nodes/TM/ nl carotid upstrokes bilaterally   LUNGS: no acc muscle use,  Nl contour chest which is clear to A and P bilaterally without cough on insp or exp maneuvers   CV:  RRR  no s3 or murmur or increase in P2, and no edema   ABD:  soft and nontender with nl inspiratory excursion in the supine position. No bruits or organomegaly appreciated, bowel sounds nl  MS:  Nl gait/ ext warm without deformities, calf tenderness, cyanosis or clubbing  No obvious joint restrictions   SKIN: warm and dry without lesions    NEURO:  alert, approp, nl sensorium with  no motor or cerebellar deficits apparent.         Labs ordered/ reviewed:      Chemistry      Component Value Date/Time   NA 140 05/27/2017 0947   K 4.3 05/27/2017 0947   CL 107 05/27/2017 0947   CO2 27 05/27/2017 0947   BUN 17 05/27/2017 0947   CREATININE 0.79 05/27/2017 0947      Component Value Date/Time   CALCIUM 9.0 05/27/2017 0947   ALKPHOS 71 05/27/2017 0947   AST 24 05/27/2017 0947   ALT 28 05/27/2017 0947   BILITOT 0.4 05/27/2017 0947        Lab Results  Component Value Date   WBC 4.8 05/27/2017   HGB 14.9 05/27/2017   HCT 42.4 05/27/2017   MCV 90.0 05/27/2017   PLT 208.0 05/27/2017       Lab Results  Component Value Date   TSH 12.21 (H) 05/27/2017         Lab Results  Component Value Date   ESRSEDRATE 11 11/28/2015   ESRSEDRATE 16 06/23/2015            Lab Results  Component Value Date   CHOL 167 05/27/2017   HDL 31.70 (L) 05/27/2017   LDLCALC 100 (H) 05/27/2017   LDLDIRECT 138.7 01/19/2013   TRIG 175.0 (H) 05/27/2017   CHOLHDL 5 05/27/2017           Assessment:

## 2017-05-27 NOTE — Progress Notes (Signed)
Spoke with pt and notified of results per Dr. [name]. Pt verbalized understanding and denied any questions. 

## 2017-05-29 NOTE — Assessment & Plan Note (Signed)
Colonoscopy 03/2007 Dr [name] > referred back 05/27/2017

## 2017-05-29 NOTE — Assessment & Plan Note (Signed)
Synthroid 50 mcg per day started 06/25/15 > increased to 75 mcg per day 09/03/15  - TSH 05/25/2016  = 4.8 on 75 mcg per day > increased to 100 mcg per day > 11/25/2016 =3.02  - stopped synthroid about 02/24/17 > seen 03/24/2017 and euthyroid clinically so ok to leave off unless symptoms  - TSH 12 05/27/2017 > resume synthroid 100 and recheck in 6 weeks    Since not convinced fatigue was better on vs off synthroid will push the dose up until the tsh is < 2 this time or refer to endocrine offered to pt as an option as well

## 2017-05-29 NOTE — Assessment & Plan Note (Signed)
-   pt declined PSA screening 01/20/12 and 06/12/2014 and 05/25/2016 > referred back to urology  - added asa 81 mg daily 06/12/2014   utd with vaccinations/ cholesterol profile looks good > no change rx

## 2017-05-30 ENCOUNTER — Telehealth: Payer: Self-pay | Admitting: Internal Medicine

## 2017-05-30 NOTE — Telephone Encounter (Signed)
Would try taking the synthroid 30 min before eating and not drink milk of any kind with breakfast for a few days and same problem then we need to call in synthroid 25 mcg per day and gradually build it up by doubling it every 2 weeks and if this is not working then refer to endocrinology Press photographer)

## 2017-05-30 NOTE — Telephone Encounter (Signed)
  Spoke with patient. He started that he started the levothyroxine sodium caps 100mg  on  05/28/17. He took the medication first and then had a bowl of cereal with almond milk. Afterwards, he developed a headache and felt nauseous. He did the same thing on 05/29/17 and had the same symptoms.   He wants to know if he could be having a reaction to the medication and almond milk. Denies any SOB, chest pain, or syncope. Just the headache and nauseous feeling.   MW, please advise. Thanks!

## 2017-05-30 NOTE — Telephone Encounter (Signed)
Patient is returning call [phone].

## 2017-05-30 NOTE — Telephone Encounter (Signed)
LM for patient x 1 

## 2017-05-30 NOTE — Telephone Encounter (Signed)
Spoke with pt,aware of recs.  Nothing further needed.  

## 2017-06-30 ENCOUNTER — Ambulatory Visit: Payer: 59 | Admitting: Gastroenterology

## 2017-07-04 ENCOUNTER — Encounter: Payer: Self-pay | Admitting: Internal Medicine

## 2017-07-25 ENCOUNTER — Other Ambulatory Visit: Payer: Self-pay | Admitting: Internal Medicine

## 2017-07-29 ENCOUNTER — Other Ambulatory Visit: Payer: Self-pay | Admitting: Internal Medicine

## 2017-07-30 ENCOUNTER — Other Ambulatory Visit: Payer: Self-pay | Admitting: Internal Medicine

## 2017-08-22 ENCOUNTER — Encounter: Payer: Self-pay | Admitting: Gastroenterology

## 2017-08-22 ENCOUNTER — Ambulatory Visit (INDEPENDENT_AMBULATORY_CARE_PROVIDER_SITE_OTHER): Payer: 59 | Admitting: Gastroenterology

## 2017-08-22 ENCOUNTER — Encounter (INDEPENDENT_AMBULATORY_CARE_PROVIDER_SITE_OTHER): Payer: Self-pay

## 2017-08-22 VITALS — BP 120/80 | HR 64 | Ht 75.0 in | Wt 227.0 lb

## 2017-08-22 DIAGNOSIS — Z1211 Encounter for screening for malignant neoplasm of colon: Secondary | ICD-10-CM | POA: Diagnosis not present

## 2017-08-22 DIAGNOSIS — K5909 Other constipation: Secondary | ICD-10-CM

## 2017-08-22 MED ORDER — SUPREP BOWEL PREP KIT 17.5-3.13-1.6 GM/177ML PO SOLN
ORAL | 0 refills | Status: DC
Start: 2017-08-22 — End: 2017-10-13

## 2017-08-22 MED ORDER — POLYETHYLENE GLYCOL 3350 17 GM/SCOOP PO POWD: ORAL | 0 refills | Status: DC

## 2017-08-22 NOTE — Progress Notes (Signed)
HPI :  61 y/o male with a history of chronic rhinitis, diverticulosis, referred by Dr. Christinia Gully for colon cancer screening.  He is not aware of any FH of colon cancer. He is constipation which has bothered him for years. He has a bowel movement once every 2-3 days. He is passing hard stools. He has occasional rare blood in the stools. He takes ex-lax or MOM PRN. He is trying to lose weight, has lost a few lbs recently.  He has tried fiber in the past which hasn't helped.   He has rare heartburn, doesn't need to take anything. No dysphagia.  Sister had malignancy of the GI tract - he is not sure if gastric or liver. Mother had cirrhosis of unclear etiology.  Colonoscopy 04/26/2007 -  Left sided diverticulosis  Past Medical History:  Diagnosis Date  . Chronic rhinitis   . Diverticulosis   . Other and unspecified hyperlipidemia   . Personal history of unspecified circulatory disease      Past Surgical History:  Procedure Laterality Date  . KNEE ARTHROSCOPY W/ MENISCAL REPAIR     Family History  Problem Relation Age of Onset  . Thyroid cancer Sister   . Liver cancer Sister   . Heart disease Mother   . Pancreatic cancer Mother   . Cirrhosis Mother   . Heart disease Father   . Stroke Father   . Prostate cancer Maternal Uncle   . Colon cancer Neg Hx   . Rectal cancer Neg Hx   . Esophageal cancer Neg Hx    Social History  Substance Use Topics  . Smoking status: Former Smoker    Types: Cigarettes    Quit date: 12/28/1991  . Smokeless tobacco: Never Used     Comment: "smoked on and off only occ"  . Alcohol use No   Current Outpatient Prescriptions  Medication Sig Dispense Refill  . ibuprofen (ADVIL,MOTRIN) 200 MG tablet Take 200 mg by mouth as needed.      Marland Kitchen levothyroxine (SYNTHROID, LEVOTHROID) 100 MCG tablet TAKE 1 TABLET (100 MCG TOTAL) BY MOUTH DAILY BEFORE BREAKFAST. 30 tablet 11   No current facility-administered medications for this visit.    Allergies    Allergen Reactions  . Amoxicillin      Review of Systems: All systems reviewed and negative except where noted in HPI.   Lab Results  Component Value Date   WBC 4.8 05/27/2017   HGB 14.9 05/27/2017   HCT 42.4 05/27/2017   MCV 90.0 05/27/2017   PLT 208.0 05/27/2017    Lab Results  Component Value Date   CREATININE 0.79 05/27/2017   BUN 17 05/27/2017   NA 140 05/27/2017   K 4.3 05/27/2017   CL 107 05/27/2017   CO2 27 05/27/2017    Lab Results  Component Value Date   ALT 28 05/27/2017   AST 24 05/27/2017   ALKPHOS 71 05/27/2017   BILITOT 0.4 05/27/2017     Physical Exam: BP 120/80   Pulse 64   Ht 6\' 3"  (1.905 m)   Wt 227 lb (103 kg)   BMI 28.37 kg/m  Constitutional: Pleasant,well-developed,male in no acute distress. HEENT: Normocephalic and atraumatic. Conjunctivae are normal. No scleral icterus. Neck supple.  Cardiovascular: Normal rate, regular rhythm.  Pulmonary/chest: Effort normal and breath sounds normal. No wheezing, rales or rhonchi. Abdominal: Soft, nondistended, nontender.  There are no masses palpable. No hepatomegaly. Extremities: no edema Lymphadenopathy: No cervical adenopathy noted. Neurological: Alert and oriented to person  place and time. Skin: Skin is warm and dry. No rashes noted. Psychiatric: Normal mood and affect. Behavior is normal.   ASSESSMENT AND PLAN: 61 year old male with chronic constipation presenting for colon cancer screening.  We discussed constipation and options for treatment. Recommend MiraLAX once to twice daily as needed, can titrate as needed to effect. If this is not working contact me and we can discuss other options.  Otherwise he is overdue for colon cancer screening. We discussed options to include optical colonoscopy versus stool based testing. Following this discussion, including risks and benefits of each , he wished to proceed with optical colonoscopy. Other recommendations pending this result.  [place] Cellar, MD [facility] Pager [phone]  CC: Tanda Rockers, MD

## 2017-08-22 NOTE — Patient Instructions (Signed)
You have been scheduled for a colonoscopy. Please follow written instructions given to you at your visit today.  Please pick up your prep supplies at the pharmacy within the next 1-3 days. If you use inhalers (even only as needed), please bring them with you on the day of your procedure. Your physician has requested that you go to www.startemmi.com and enter the access code given to you at your visit today. This web site gives a general overview about your procedure. However, you should still follow specific instructions given to you by our office regarding your preparation for the procedure.  If you are age 60 or older, your body mass index should be between 23-30. Your Body mass index is 28.37 kg/m. If this is out of the aforementioned range listed, please consider follow up with your Primary Care Provider.  If you are age 50 or younger, your body mass index should be between 19-25. Your Body mass index is 28.37 kg/m. If this is out of the aformentioned range listed, please consider follow up with your Primary Care Provider.     Please take Miralax, 17 grams, 1 to 2 times a day as needed.

## 2017-09-23 ENCOUNTER — Encounter: Payer: Self-pay | Admitting: Gastroenterology

## 2017-09-30 ENCOUNTER — Encounter: Payer: 59 | Admitting: Gastroenterology

## 2017-09-30 ENCOUNTER — Telehealth: Payer: Self-pay | Admitting: Gastroenterology

## 2017-09-30 NOTE — Telephone Encounter (Signed)
Patient ate lunch and didn't start prep yet at 10PM when he called, he just returned from a party and said after lunch he only drank water. I advised him to go ahead with prep but he may not have a good prep. Patient wanted to reschedule and didn't think he can drink the prep.

## 2017-09-30 NOTE — Telephone Encounter (Signed)
Okay he can reschedule when he is ready to. Thanks

## 2017-10-13 ENCOUNTER — Encounter: Payer: Self-pay | Admitting: Gastroenterology

## 2017-10-13 ENCOUNTER — Ambulatory Visit (AMBULATORY_SURGERY_CENTER): Payer: 59 | Admitting: Gastroenterology

## 2017-10-13 VITALS — BP 139/93 | HR 58 | Temp 98.6°F | Resp 11 | Ht 75.0 in | Wt 227.0 lb

## 2017-10-13 DIAGNOSIS — K635 Polyp of colon: Secondary | ICD-10-CM

## 2017-10-13 DIAGNOSIS — D120 Benign neoplasm of cecum: Secondary | ICD-10-CM

## 2017-10-13 DIAGNOSIS — D124 Benign neoplasm of descending colon: Secondary | ICD-10-CM

## 2017-10-13 DIAGNOSIS — Z1211 Encounter for screening for malignant neoplasm of colon: Secondary | ICD-10-CM | POA: Diagnosis not present

## 2017-10-13 MED ORDER — SODIUM CHLORIDE 0.9 % IV SOLN: 500.0000 mL | INTRAVENOUS | Status: DC

## 2017-10-13 NOTE — Progress Notes (Signed)
To recovery, report to RN, VSS. 

## 2017-10-13 NOTE — Progress Notes (Signed)
Called to room to assist during endoscopic procedure.  Patient ID and intended procedure confirmed with present staff. Received instructions for my participation in the procedure from the performing physician.  

## 2017-10-13 NOTE — Op Note (Signed)
[place] Patient Name: Joel Franco Procedure Date: 10/13/2017 3:18 PM MRN: [id number] Endoscopist: Remo Lipps P. Jones Viviani MD, MD Age: 61 Referring MD:  Date of Birth: Aug 17, 1956 Gender: Male Account #: [id number] Procedure:                Colonoscopy Indications:              Screening for colorectal malignant neoplasm Medicines:                Monitored Anesthesia Care Procedure:                Pre-Anesthesia Assessment:                           - Prior to the procedure, a History and Physical                            was performed, and patient medications and                            allergies were reviewed. The patient's tolerance of                            previous anesthesia was also reviewed. The risks                            and benefits of the procedure and the sedation                            options and risks were discussed with the patient.                            All questions were answered, and informed consent                            was obtained. Prior Anticoagulants: The patient has                            taken no previous anticoagulant or antiplatelet                            agents. ASA Grade Assessment: II - A patient with                            mild systemic disease. After reviewing the risks                            and benefits, the patient was deemed in                            satisfactory condition to undergo the procedure.                           After obtaining informed consent, the colonoscope  was passed under direct vision. Throughout the                            procedure, the patient's blood pressure, pulse, and                            oxygen saturations were monitored continuously. The                            Colonoscope was introduced through the anus and                            advanced to the the cecum, identified by                            appendiceal orifice and  ileocecal valve. The                            colonoscopy was performed without difficulty. The                            patient tolerated the procedure well. The quality                            of the bowel preparation was adequate. The                            ileocecal valve, appendiceal orifice, and rectum                            were photographed. Scope In: 3:24:33 PM Scope Out: 3:50:12 PM Scope Withdrawal Time: 0 hours 10 minutes 52 seconds  Total Procedure Duration: 0 hours 25 minutes 39 seconds  Findings:                 The perianal and digital rectal examinations were                            normal.                           Multiple medium-mouthed diverticula were found in                            the left colon.                           A 3 mm polyp was found in the cecum. The polyp was                            sessile. The polyp was removed with a cold snare.                            Resection and retrieval were complete.  A 4 mm polyp was found in the descending colon. The                            polyp was sessile. The polyp was removed with a                            cold snare. Resection and retrieval were complete.                           The colon revealed excessive looping / tortousity                            which prolonged the procedure.                           The exam was otherwise without abnormality on                            direct and retroflexion views. Complications:            No immediate complications. Estimated blood loss:                            Minimal. Estimated Blood Loss:     Estimated blood loss was minimal. Impression:               - Diverticulosis in the left colon.                           - One 3 mm polyp in the cecum, removed with a cold                            snare. Resected and retrieved.                           - One 4 mm polyp in the descending colon, removed                             with a cold snare. Resected and retrieved.                           - There was significant looping of the colon.                           - The examination was otherwise normal on direct                            and retroflexion views. Recommendation:           - Patient has a contact number available for                            emergencies. The signs and symptoms of potential  delayed complications were discussed with the                            patient. Return to normal activities tomorrow.                            Written discharge instructions were provided to the                            patient.                           - Resume previous diet.                           - Continue present medications.                           - Await pathology results.                           - Repeat colonoscopy is recommended for                            surveillance. The colonoscopy date will be                            determined after pathology results from today's                            exam become available for review.                           - No ibuprofen, naproxen, or other non-steroidal                            anti-inflammatory drugs for 2 weeks after polyp                            removal. Remo Lipps P. Jacquelinne Speak MD, MD 10/13/2017 3:55:37 PM This report has been signed electronically.

## 2017-10-13 NOTE — Patient Instructions (Signed)
YOU HAD AN ENDOSCOPIC PROCEDURE TODAY AT [facility]:   Refer to the procedure report that was given to you for any specific questions about what was found during the examination.  If the procedure report does not answer your questions, please call your gastroenterologist to clarify.  If you requested that your care partner not be given the details of your procedure findings, then the procedure report has been included in a sealed envelope for you to review at your convenience later.  YOU SHOULD EXPECT: Some feelings of bloating in the abdomen. Passage of more gas than usual.  Walking can help get rid of the air that was put into your GI tract during the procedure and reduce the bloating. If you had a lower endoscopy (such as a colonoscopy or flexible sigmoidoscopy) you may notice spotting of blood in your stool or on the toilet paper. If you underwent a bowel prep for your procedure, you may not have a normal bowel movement for a few days.  Please Note:  You might notice some irritation and congestion in your nose or some drainage.  This is from the oxygen used during your procedure.  There is no need for concern and it should clear up in a day or so.  SYMPTOMS TO REPORT IMMEDIATELY:   Following lower endoscopy (colonoscopy or flexible sigmoidoscopy):  Excessive amounts of blood in the stool  Significant tenderness or worsening of abdominal pains  Swelling of the abdomen that is new, acute  Fever of 100F or higher  Fol For urgent or emergent issues, a gastroenterologist can be reached at any hour by calling [phone].   DIET:  We do recommend a small meal at first, but then you may proceed to your regular diet.  Drink plenty of fluids but you should avoid alcoholic beverages for 24 hours.  ACTIVITY:  You should plan to take it easy for the rest of today and you should NOT DRIVE or use heavy machinery until tomorrow (because of the sedation medicines used during the test).     FOLLOW UP: Our staff will call the number listed on your records the next business day following your procedure to check on you and address any questions or concerns that you may have regarding the information given to you following your procedure. If we do not reach you, we will leave a message.  However, if you are feeling well and you are not experiencing any problems, there is no need to return our call.  We will assume that you have returned to your regular daily activities without incident.  If any biopsies were taken you will be contacted by phone or by letter within the next 1-3 weeks.  Please call us at [phone] if you have not heard about the biopsies in 3 weeks.    SIGNATURES/CONFIDENTIALITY: You and/or your care partner have signed paperwork which will be entered into your electronic medical record.  These signatures attest to the fact that that the information above on your After Visit Summary has been reviewed and is understood.  Full responsibility of the confidentiality of this discharge information lies with you and/or your care-partner.  Diverticulosis and polyp information given.  No ibuprofen, naproxen, or other non steroidal anti inflammatory meds for 2 weeks post procedure.

## 2017-10-14 ENCOUNTER — Telehealth: Payer: Self-pay

## 2017-10-14 NOTE — Telephone Encounter (Signed)
  Follow up Call-  Call Joel Franco number 10/13/2017  Post procedure Call [name] phone  # [phone]  Permission to leave phone message Yes  Some recent data might be hidden     Patient questions:  Do you have a fever, pain , or abdominal swelling? No. Pain Score  0 *  Have you tolerated food without any problems? Yes.    Have you been able to return to your normal activities? Yes.    Do you have any questions about your discharge instructions: Diet   No. Medications  No. Follow up visit  No.  Do you have questions or concerns about your Care? No.  Actions: * If pain score is 4 or above: No action needed, pain <4.

## 2017-10-14 NOTE — Telephone Encounter (Signed)
  Follow up Call-  Call back number 10/13/2017  Post procedure Call Back phone  # [phone]  Permission to leave phone message Yes  Some recent data might be hidden    Attempted to leave a message but the mailbox was full.

## 2017-10-19 ENCOUNTER — Encounter: Payer: Self-pay | Admitting: Gastroenterology

## 2017-11-28 ENCOUNTER — Ambulatory Visit (INDEPENDENT_AMBULATORY_CARE_PROVIDER_SITE_OTHER): Payer: 59 | Admitting: Internal Medicine

## 2017-11-28 ENCOUNTER — Encounter: Payer: Self-pay | Admitting: Internal Medicine

## 2017-11-28 ENCOUNTER — Other Ambulatory Visit (INDEPENDENT_AMBULATORY_CARE_PROVIDER_SITE_OTHER): Payer: 59

## 2017-11-28 VITALS — BP 142/72 | HR 61 | Ht 75.0 in | Wt 231.4 lb

## 2017-11-28 DIAGNOSIS — B356 Tinea cruris: Secondary | ICD-10-CM | POA: Diagnosis not present

## 2017-11-28 DIAGNOSIS — E039 Hypothyroidism, unspecified: Secondary | ICD-10-CM | POA: Diagnosis not present

## 2017-11-28 LAB — TSH: TSH: 8.37 u[IU]/mL — AB (ref 0.35–4.50)

## 2017-11-28 MED ORDER — CLOTRIMAZOLE-BETAMETHASONE 1-0.05 % EX CREA: 1.0000 "application " | TOPICAL_CREAM | Freq: Two times a day (BID) | CUTANEOUS | 5 refills | Status: DC

## 2017-11-28 NOTE — Progress Notes (Signed)
Subjective:     Patient ID: Joel Franco, male   DOB: [birth year]     MRN: [id number]    Brief patient profile:  12 yowm remote minimal smoker with chronic palpitations that typically occur more at rest than they do with activity and not progressive, if anything less noticeable as he has aged .   History of Present Illness  June 12, 2009 cpx no urinary problems, overall doing fine except for problems staying asleep. rec sleep hygiene reviewed    07/22/2015 NP Follow up : Hypothyroid  Pt returns for evaluation .  Seen last ov with fatigue  Labs showed hypothyroidism with TSH ~8.47  He was started on Synthroid 13mcg.  He is starting to feel better. Less fatigue and joint pains.  Had tick bite with rash in May , given 3 weeks of Doxycycline for  Suspected Lymes dz . He did not go for labs as recommended in May  For Lyme titer. He denies muscle weakness, rash or neck stiffness  No speech issues.  No chest pain, orthopnea, edema or fever.  MW pt here for 4 week follow up. Pt is taking synthroid. Pt c/o fatigue and dry mouth.  rec Increase tsh to 75 mcg daily    11/28/2015  f/u ov/Abreanna Drawdy re: hypothyroid Chief Complaint  Patient presents with  . Follow-up    Pt c/o restless sleep, insomnia and chronic fatigue. Pt's wife would like to have him checked for sleep apnea. Pt states that his symptoms have been increasingly worse since the tick bite earlier this year.   arthritis better,  Twice a week advil most of the time  Falls asleep fine but wakes up in 2-3 hours then reads x up to 30 min and after sev hours but does not feel refreshed in am  Better p benadryl but feels hungover  rec Tetanus shot today  Try trazodone 25 mg at bedtime    11/28/15 >  05/25/2016  Lots of phone calls/ lmtcb re trazadone side effects/ thyroid dose.   05/25/2016  f/u ov/Caterine Mcmeans re: cpx  Chief Complaint  Patient presents with  . Annual Exam    Pt is fasting today. He states injured his left knee approx 6 wks  ago-seeing [facility]. No other new co's today.   limited by R knee  rec Increased synthroid to 100   11/25/2016  f/u ov/Ardit Danh re:  Hypothyroid / urinary hesitance/ no maint meds from urology yet  Chief Complaint  Patient presents with  . Follow-up    denies new co's  R knee surgery set for Dec 13th 2017 Dr Veverly Fells / min need for advil/ taking fish oil for joints  Variable decrease fos despite rx with cipro by urology rec f/u urology yearly due 06/2017    03/24/2017  f/u ov/Ajax Schroll re:  Chief Complaint  Patient presents with  . Follow-up    Stopped synthroid 3-4 wks ago b/c he ran out and states he felt fine. He does c/o constipation, started "forever and a day ago".    mild fatigue, wt gain, constipation but these were chronic issues before stopped the synthroid  Not limited by breathing from desired activities   Variable decrease fos/ f/u by [place] but no notes in epic     05/27/2017  f/u ov/Margarie Mcguirt re: hypothyroid / urinary hesitance  Chief Complaint  Patient presents with  . Annual Exam    Pt is fasting.  Still has constipation that comes and goes. He states that he  feels very tired all of the time.    due for f/u urology 06/2017 Overdue for colonoscopy 03/2007 last study  Not limited by breathing from desired activities  Main complaint is fatigue ? Worse off synthroid (he denies)   rec If Dr Karsten Ro doesn't call you before 07/27/17 for follow up then you should call him  Please see patient coordinator before you leave today  to schedule GI eval/ colonoscopy due Advil can be taken up to 3-4 with meals as needed for joint pain Please remember to go to the lab department downstairs in the basement  for your tests - we will call you with the results when they are available. Please schedule a follow up visit in 6 months but call sooner if needed Add:  tsh 12 so started back on 100 mcg / day and f/u in 6 weeks with goal tsh < 2       11/28/2017  f/u ov/Jalayah Gutridge re:  Hypothyroid / tinea  cruris  Chief Complaint  Patient presents with  . Follow-up    Overall doing well. He requests a refill on cream for groin rash.    lotrisone works great when develops rash, more in warmer months  Fatigue resolved, sleeping much better, rarely using benadryl hs prn  No problems with exertion/ constipation/ change in skin or heat/cold intol  No obvious day to day or daytime variability or assoc excess/ purulent sputum or mucus plugs or hemoptysis or cp or chest tightness, subjective wheeze or overt sinus or hb symptoms. No unusual exposure hx or h/o childhood pna/ asthma or knowledge of premature birth.  Sleeping ok flat without nocturnal  or early am exacerbation  of respiratory  c/o's or need for noct saba. Also denies any obvious fluctuation of symptoms with weather or environmental changes or other aggravating or alleviating factors except as outlined above   Current Allergies, Complete Past Medical History, Past Surgical History, Family History, and Social History were reviewed in [employer] record.  ROS  The following are not active complaints unless bolded Hoarseness, sore throat, dysphagia, dental problems, itching, sneezing,  nasal congestion or discharge of excess mucus or purulent secretions, ear ache,   fever, chills, sweats, unintended wt loss or wt gain, classically pleuritic or exertional cp,  orthopnea pnd or leg swelling, presyncope, palpitations, abdominal pain, anorexia, nausea, vomiting, diarrhea  or change in bowel habits or change in bladder habits, change in stools or change in urine, dysuria, hematuria,  rash, arthralgias, visual complaints, headache, numbness, weakness or ataxia or problems with walking or coordination,  change in mood/affect or memory.        Current Meds  Medication Sig  . ibuprofen (ADVIL,MOTRIN) 200 MG tablet Take 200 mg by mouth as needed.    Marland Kitchen levothyroxine (SYNTHROID, LEVOTHROID) 100 MCG tablet TAKE 1 TABLET (100 MCG TOTAL)  BY MOUTH DAILY BEFORE BREAKFAST.  Marland Kitchen polyethylene glycol powder (GLYCOLAX/MIRALAX) powder Take 17 grams  1 to 2 times a day . Titrate as needed.         Past Medical History:  HYPERLIPIDEMIA (ICD-272.4)  - target LDL < 130 based on positive family history and remote smoking hx  CHRONIC RHINITIS (ICD-472.0)  PALPITATIONS, HX OF (ICD-V12.50)  DIVERTICULOSIS........................................................................Marland Kitchen     [facility] (was [name])  - See colonoscopy 04/26/07  neck pain positive MRI with right C6/7 protrusion ......................Marland Kitchen      Dr. Ninfa Linden  Urinary Hestiance/ decrease FOS 2017 .........................................  [place]...................................................................Marland KitchenWert  -  CPX 05/27/2017  - DT 11/28/2015  - Pneumovax 05/2005 and 01/21/10   Family History:  Cancer in two sisters one liver, one thyroid  heart disease in mother in her 47s father in his 62s  Stroke father  Allergies  brother  Enlarged prostate- Father no prostate ca Prostate canceer- Maternal uncle   Social History:  self-employed---remodeling married and lives with wife  has children  Patient states former smoker, light, quit 1993  Occ ETOH        Objective:   Physical Exam  amb somber wm nad  Vital signs reviewed - Note on arrival 02 sats  98% on RA    Wt 224 07/27/2011 > 01/19/2013  216 > 04/15/14 224   > 06/12/2014 227 >228 05/27/2015 > 06/28/2015  227>228 07/22/2015 > 11/28/2015  223 > 05/25/2016  228 >  03/24/2017  232 > 05/27/2017  234 > 231     HEENT: nl dentition, turbinates bilaterally, and oropharynx. Nl external ear canals without cough reflex   NECK :  without JVD/Nodes/TM/ nl carotid upstrokes bilaterally   LUNGS: no acc muscle use,  Nl contour chest which is clear to A and P bilaterally without cough on insp or exp maneuvers   CV:  RRR  no s3 or murmur or increase in P2, and no edema   ABD:  soft and nontender  with nl inspiratory excursion in the supine position. No bruits or organomegaly appreciated, bowel sounds nl  MS:  Nl gait/ ext warm without deformities, calf tenderness, cyanosis or clubbing No obvious joint restrictions   SKIN: warm and dry without lesions    NEURO:  alert, approp, nl sensorium with  no motor or cerebellar deficits apparent.      Lab Results  Component Value Date   TSH 8.37 (H) 11/28/2017                    Assessment:

## 2017-11-28 NOTE — Patient Instructions (Signed)
Please remember to go to the lab department downstairs in the basement  for your tests - we will call you with the results when they are available.      Please schedule a follow up visit in 6  months but call sooner if needed CPX on return

## 2017-11-28 NOTE — Assessment & Plan Note (Signed)
rx lotrisone prn >  Controls well as of ov 11/28/2017 > continue

## 2017-11-29 ENCOUNTER — Other Ambulatory Visit: Payer: Self-pay | Admitting: Internal Medicine

## 2017-11-29 MED ORDER — LEVOTHYROXINE SODIUM 125 MCG PO TABS: 125.0000 ug | ORAL_TABLET | Freq: Every day | ORAL | 11 refills | Status: DC

## 2017-11-29 NOTE — Progress Notes (Signed)
Spoke with pt and notified of results per Dr. Melvyn Novas. Pt verbalized understanding and denied any questions. New rx sent to [facility]

## 2017-11-30 ENCOUNTER — Encounter: Payer: Self-pay | Admitting: Internal Medicine

## 2017-11-30 NOTE — Assessment & Plan Note (Signed)
Synthroid 50 mcg per day started 06/25/15 > increased to 75 mcg per day 09/03/15  - TSH 05/25/2016  = 4.8 on 75 mcg per day > increased to 100 mcg per day > 11/25/2016 =3.02  - stopped synthroid about 02/24/17 > seen 03/24/2017 and euthyroid clinically so ok to leave off unless symptoms  - TSH 12 05/27/2017 > resume synthroid 100 and recheck in 6 weeks    returned 11/28/2017 =  8.37 so rec 125 mcg daily and f/u in one month

## 2017-12-23 ENCOUNTER — Telehealth: Payer: Self-pay | Admitting: Internal Medicine

## 2017-12-23 NOTE — Telephone Encounter (Signed)
Lotrisone cream is for yeast infection. Is this the same rash he was seen for by MW?- ok to refill Lotrisone .  For allergic rash/ hives would use an antihistamine like benadryl.

## 2017-12-23 NOTE — Telephone Encounter (Signed)
Pt is aware of below message and voiced his understanding.  Pt states that Rx for Lotrisone is not needed, as this medication was last refilled on 11/28/17 Nothing further is needed.

## 2017-12-23 NOTE — Telephone Encounter (Signed)
Patient asks [place] to call him at [phone].

## 2017-12-23 NOTE — Telephone Encounter (Signed)
Pt returning call. Cb is [phone].

## 2017-12-23 NOTE — Telephone Encounter (Signed)
Primary care patient of Dr Melvyn Novas Last ov 12.3.18  Called spoke with patient who reports skin irritation > under arms, behind knees, below groin area, under biceps, and along his sides Whelping up under the bicep area A little itchy, not bad Small red bumps  Began around Christmas day  Wife received perfume and was "spraying it on every body" and he later smelled it on his shirt and wonders if this may be contributing.  Only other change is an increase in his Synthroid from 167mcg to 151mcg per 12.3.18 labs.  He tried calling his dermatologist but they couldn't see him.  No new foods over the holiday, no change in laundry detergent or fabric softener.    Daughter suggested Benadryl at time, but he hasn't tried this MW Rx'd Lotrisone cream at last ov and wonders if this would be okay to try  Dr Annamaria Boots please advise, thank you. Allergies  Allergen Reactions  . Amoxicillin

## 2017-12-23 NOTE — Telephone Encounter (Signed)
lmtcb x1 for pt. 

## 2018-01-06 ENCOUNTER — Other Ambulatory Visit: Payer: Self-pay

## 2018-01-06 ENCOUNTER — Encounter: Payer: Self-pay | Admitting: Internal Medicine

## 2018-01-06 ENCOUNTER — Ambulatory Visit (INDEPENDENT_AMBULATORY_CARE_PROVIDER_SITE_OTHER): Payer: Self-pay | Admitting: Internal Medicine

## 2018-01-06 VITALS — BP 120/62 | HR 65 | Ht 75.0 in | Wt 231.0 lb

## 2018-01-06 DIAGNOSIS — S90212A Contusion of left great toe with damage to nail, initial encounter: Secondary | ICD-10-CM

## 2018-01-06 DIAGNOSIS — E039 Hypothyroidism, unspecified: Secondary | ICD-10-CM

## 2018-01-06 DIAGNOSIS — G4700 Insomnia, unspecified: Secondary | ICD-10-CM

## 2018-01-06 NOTE — Assessment & Plan Note (Signed)
Synthroid 50 mcg per day started 06/25/15 > increased to 75 mcg per day 09/03/15  - TSH 05/25/2016  = 4.8 on 75 mcg per day > increased to 100 mcg per day > 11/25/2016 =3.02  - stopped synthroid about 02/24/17 > seen 03/24/2017 and euthyroid clinically so ok to leave off unless symptoms   - TSH 12 05/27/2017 > resume synthroid 100 and recheck in 6 weeks > returned 11/28/2017 =  8.37 so rec 125 mcg daily and f/u by 01/27/18

## 2018-01-06 NOTE — Progress Notes (Signed)
Subjective:     Patient ID: Joel Franco, male   DOB: 01/25/1956     MRN: [id number]    Brief patient profile:  37 yowm remote minimal smoker with chronic palpitations that typically occur more at rest than they do with activity and not progressive, if anything less noticeable as he has aged .   History of Present Illness  June 12, 2009 cpx no urinary problems, overall doing fine except for problems staying asleep. rec sleep hygiene reviewed    07/22/2015 NP Follow up : Hypothyroid  Pt returns for evaluation .  Seen last ov with fatigue  Labs showed hypothyroidism with TSH ~8.47  He was started on Synthroid 68mcg.  He is starting to feel better. Less fatigue and joint pains.  Had tick bite with rash in May , given 3 weeks of Doxycycline for  Suspected Lymes dz . He did not go for labs as recommended in May  For Lyme titer. He denies muscle weakness, rash or neck stiffness  No speech issues.  No chest pain, orthopnea, edema or fever.  MW pt here for 4 week follow up. Pt is taking synthroid. Pt c/o fatigue and dry mouth.  rec Increase tsh to 75 mcg daily    11/28/2015  f/u ov/Joel Franco re: hypothyroid Chief Complaint  Patient presents with  . Follow-up    Pt c/o restless sleep, insomnia and chronic fatigue. Pt's wife would like to have him checked for sleep apnea. Pt states that his symptoms have been increasingly worse since the tick bite earlier this year.   arthritis better,  Twice a week advil most of the time  Falls asleep fine but wakes up in 2-3 hours then reads x up to 30 min and after sev hours but does not feel refreshed in am  Better p benadryl but feels hungover  rec Tetanus shot today  Try trazodone 25 mg at bedtime    11/28/15 >  05/25/2016  Lots of phone calls/ lmtcb re trazadone side effects/ thyroid dose.   05/25/2016  f/u ov/Joel Franco re: cpx  Chief Complaint  Patient presents with  . Annual Exam    Pt is fasting today. He states injured his left knee approx 6 wks  ago-seeing [facility]. No other new co's today.   limited by R knee  rec Increased synthroid to 100   11/25/2016  f/u ov/Joel Franco re:  Hypothyroid / urinary hesitance/ no maint meds from urology yet  Chief Complaint  Patient presents with  . Follow-up    denies new co's  R knee surgery set for Dec 13th 2017 Joel Franco / min need for advil/ taking fish oil for joints  Variable decrease fos despite rx with cipro by urology rec f/u urology yearly due 06/2017    03/24/2017  f/u ov/Joel Franco re:  Chief Complaint  Patient presents with  . Follow-up    Stopped synthroid 3-4 wks ago b/c he ran out and states he felt fine. He does c/o constipation, started "forever and a day ago".    mild fatigue, wt gain, constipation but these were chronic issues before stopped the synthroid  Not limited by breathing from desired activities   Variable decrease fos/ f/u by Ottelin but no notes in epic     05/27/2017  f/u ov/Joel Franco re: hypothyroid / urinary hesitance  Chief Complaint  Patient presents with  . Annual Exam    Pt is fasting.  Still has constipation that comes and goes. He states that he  feels very tired all of the time.    due for f/u urology 06/2017 Overdue for colonoscopy 03/2007 last study  Not limited by breathing from desired activities  Main complaint is fatigue ? Worse off synthroid (he denies)   rec If Joel Franco doesn't call you before 07/27/17 for follow up then you should call him  Please see patient coordinator before you leave today  to schedule GI eval/ colonoscopy due Advil can be taken up to 3-4 with meals as needed for joint pain Please remember to go to the lab department downstairs in the basement  for your tests - we will call you with the results when they are available. Please schedule a follow up visit in 6 months but call sooner if needed Add:  tsh 12 so started back on 100 mcg / day and f/u in 6 weeks with goal tsh < 2     01/06/2018 acute extended ov/Joel Franco re:  Toe nail change/ L  great toe Chief Complaint  Patient presents with  . Acute Visit    Pt states he started noticing his great left toe nail turning a dark color for the past 3 wks- non painful and he can not recall any injury.    has been working out at the y and incidentally noted L great toenail discoloration but no pain at all nor obvious h/o injury  No obvious day to day or daytime variability or assoc  or cp or chest tightness, subjective wheeze or overt sinus or hb symptoms. No unusual exposure hx or h/o childhood pna/ asthma or knowledge of premature birth.  Sleeping ok flat without nocturnal  or early am exacerbation  of respiratory  c/o's or need for noct saba. Also denies any obvious fluctuation of symptoms with weather or environmental changes or other aggravating or alleviating factors except as outlined above   Current Allergies, Complete Past Medical History, Past Surgical History, Family History, and Social History were reviewed in [employer] record.  ROS  The following are not active complaints unless bolded Hoarseness, sore throat, dysphagia, dental problems, itching, sneezing,  nasal congestion or discharge of excess mucus or purulent secretions, ear ache,   fever, chills, sweats, unintended wt loss or wt gain, classically pleuritic or exertional cp,  orthopnea pnd or leg swelling, presyncope, palpitations, abdominal pain, anorexia, nausea, vomiting, diarrhea  or change in bowel habits or change in bladder habits, change in stools or change in urine, dysuria, hematuria,  rash, arthralgias, visual complaints, headache, numbness, weakness or ataxia or problems with walking or coordination,  change in mood/affect or memory.          Current Meds  Medication Sig  . clotrimazole-betamethasone (LOTRISONE) cream Apply 1 application topically 2 (two) times daily. Apply twice daily as needed  . ibuprofen (ADVIL,MOTRIN) 200 MG tablet Take 200 mg by mouth as needed.    Marland Kitchen  levothyroxine (SYNTHROID) 125 MCG tablet Take 1 tablet (125 mcg total) by mouth daily before breakfast.  . polyethylene glycol powder (GLYCOLAX/MIRALAX) powder Take 17 grams  1 to 2 times a day . Titrate as needed.            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 Ninfa Linden  Urinary Hestiance/ decrease FOS 2017 .........................................  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 Nl external ear canals without cough reflex   NECK :  without JVD/Nodes/TM/ nl carotid upstrokes bilaterally   LUNGS: no acc muscle use,  Nl contour chest which is clear to A and P bilaterally without cough on insp or exp maneuvers   CV:  RRR   no s3 or murmur or increase in P2, and no edema   ABD:  soft and nontender with nl inspiratory excursion in the supine position. No bruits or organomegaly appreciated, bowel sounds nl  MS:  Nl gait/ ext warm without deformities, calf tenderness, cyanosis or clubbing No obvious joint restrictions   SKIN: warm and dry without lesions -  L toe nail bed moderately diffusely  hyperpigmented s obvious hematoma/ not tender, good skin color/warmth and nl pulses    NEURO:  alert, approp, nl sensorium with  no motor or cerebellar deficits apparent.      Assessment:

## 2018-01-06 NOTE — Assessment & Plan Note (Signed)
Improved somewhat/ reviewed instructions to optimize s need for meds

## 2018-01-06 NOTE — Patient Instructions (Addendum)
Before the end of the month stop by the lab to have your TSH    cpx due 05/27/2018  - call sooner if needed

## 2018-01-06 NOTE — Assessment & Plan Note (Addendum)
Noted 11/2017  / painless  Does not recall trauma but now working out at the y  Which is new for him - no assoc pain at all, only here at wife's insistence "based on google search"   rec reassuarance/ comfortable shoes for working out/ f/u podiatry prn symptoms

## 2018-01-27 ENCOUNTER — Telehealth: Payer: Self-pay | Admitting: Internal Medicine

## 2018-01-27 ENCOUNTER — Telehealth: Payer: Self-pay | Admitting: Pharmacy Technician

## 2018-01-27 ENCOUNTER — Other Ambulatory Visit (INDEPENDENT_AMBULATORY_CARE_PROVIDER_SITE_OTHER): Payer: Managed Care, Other (non HMO)

## 2018-01-27 DIAGNOSIS — E039 Hypothyroidism, unspecified: Secondary | ICD-10-CM

## 2018-01-27 LAB — TSH: TSH: 2.3 u[IU]/mL (ref 0.35–4.50)

## 2018-01-27 NOTE — Progress Notes (Signed)
Left detailed msg informing him lab was normal

## 2018-01-27 NOTE — Telephone Encounter (Signed)
I had called and left msg on his voicemail telling him that his labs are normal  Not sure why he is calling back  LMTCB

## 2018-01-30 NOTE — Telephone Encounter (Signed)
Error

## 2018-01-30 NOTE — Telephone Encounter (Signed)
Spoke with pt. He is aware of results. Nothing further was needed. 

## 2018-02-20 ENCOUNTER — Ambulatory Visit: Payer: Managed Care, Other (non HMO) | Admitting: Internal Medicine

## 2018-02-20 ENCOUNTER — Telehealth: Payer: Self-pay | Admitting: Internal Medicine

## 2018-02-20 NOTE — Telephone Encounter (Signed)
Spoke with Joel Franco, we do not do nurse visits. Patient will need to be checked in to see MW (MW is his primary care doctor). Explained this to [place], nothing else needed.

## 2018-05-29 ENCOUNTER — Ambulatory Visit: Payer: Managed Care, Other (non HMO) | Admitting: Internal Medicine

## 2018-05-29 ENCOUNTER — Encounter: Payer: Self-pay | Admitting: Internal Medicine

## 2018-05-29 ENCOUNTER — Other Ambulatory Visit (INDEPENDENT_AMBULATORY_CARE_PROVIDER_SITE_OTHER): Payer: Managed Care, Other (non HMO)

## 2018-05-29 VITALS — BP 140/82 | HR 70 | Ht 75.0 in | Wt 235.0 lb

## 2018-05-29 DIAGNOSIS — H6123 Impacted cerumen, bilateral: Secondary | ICD-10-CM | POA: Diagnosis not present

## 2018-05-29 DIAGNOSIS — Z0000 Encounter for general adult medical examination without abnormal findings: Secondary | ICD-10-CM

## 2018-05-29 DIAGNOSIS — E039 Hypothyroidism, unspecified: Secondary | ICD-10-CM | POA: Diagnosis not present

## 2018-05-29 DIAGNOSIS — E7800 Pure hypercholesterolemia, unspecified: Secondary | ICD-10-CM | POA: Diagnosis not present

## 2018-05-29 LAB — CBC WITH DIFFERENTIAL/PLATELET
BASOS ABS: 0 10*3/uL (ref 0.0–0.1)
Basophils Relative: 0.8 % (ref 0.0–3.0)
Eosinophils Absolute: 0.4 10*3/uL (ref 0.0–0.7)
Eosinophils Relative: 7.4 % — ABNORMAL HIGH (ref 0.0–5.0)
HCT: 41.9 % (ref 39.0–52.0)
Hemoglobin: 14.6 g/dL (ref 13.0–17.0)
LYMPHS PCT: 26 % (ref 12.0–46.0)
Lymphs Abs: 1.4 10*3/uL (ref 0.7–4.0)
MCHC: 34.9 g/dL (ref 30.0–36.0)
MCV: 90.9 fl (ref 78.0–100.0)
MONO ABS: 0.5 10*3/uL (ref 0.1–1.0)
Monocytes Relative: 9.1 % (ref 3.0–12.0)
NEUTROS ABS: 2.9 10*3/uL (ref 1.4–7.7)
NEUTROS PCT: 56.7 % (ref 43.0–77.0)
PLATELETS: 208 10*3/uL (ref 150.0–400.0)
RBC: 4.61 Mil/uL (ref 4.22–5.81)
RDW: 12.9 % (ref 11.5–15.5)
WBC: 5.2 10*3/uL (ref 4.0–10.5)

## 2018-05-29 LAB — BASIC METABOLIC PANEL
BUN: 17 mg/dL (ref 6–23)
CALCIUM: 9 mg/dL (ref 8.4–10.5)
CO2: 25 meq/L (ref 19–32)
CREATININE: 0.79 mg/dL (ref 0.40–1.50)
Chloride: 106 mEq/L (ref 96–112)
GFR: 105.88 mL/min (ref >60.00)
Glucose, Bld: 107 mg/dL — ABNORMAL HIGH (ref 70–99)
Potassium: 4.1 mEq/L (ref 3.5–5.1)
Sodium: 139 mEq/L (ref 135–145)

## 2018-05-29 LAB — LIPID PANEL
Cholesterol: 186 mg/dL (ref 0–200)
HDL: 33 mg/dL — AB (ref >39.00)
LDL Cholesterol: 125 mg/dL — ABNORMAL HIGH (ref 0–99)
NONHDL: 153.44
Total CHOL/HDL Ratio: 6
Triglycerides: 140 mg/dL (ref 0.0–149.0)
VLDL: 28 mg/dL (ref 0.0–40.0)

## 2018-05-29 LAB — HEPATIC FUNCTION PANEL
ALBUMIN: 4.2 g/dL (ref 3.5–5.2)
ALK PHOS: 69 U/L (ref 39–117)
ALT: 39 U/L (ref 0–53)
AST: 29 U/L (ref 0–37)
BILIRUBIN DIRECT: 0.1 mg/dL (ref 0.0–0.3)
Total Bilirubin: 0.6 mg/dL (ref 0.2–1.2)
Total Protein: 6.7 g/dL (ref 6.0–8.3)

## 2018-05-29 LAB — TSH: TSH: 3.75 u[IU]/mL (ref 0.35–4.50)

## 2018-05-29 NOTE — Progress Notes (Signed)
Subjective:     Patient ID: Joel Franco, male   DOB: 1956/02/22     MRN: [id number]    Brief patient profile:  76 yowm remote minimal smoker with chronic palpitations that typically occur more at rest than they do with activity and not progressive, if anything less noticeable as he has aged .   History of Present Illness  June 12, 2009 cpx no urinary problems, overall doing fine except for problems staying asleep. rec sleep hygiene reviewed    07/22/2015 NP Follow up : Hypothyroid  Pt returns for evaluation .  Seen last ov with fatigue  Labs showed hypothyroidism with TSH ~8.47  He was started on Synthroid 1mcg.  He is starting to feel better. Less fatigue and joint pains.  Had tick bite with rash in May , given 3 weeks of Doxycycline for  Suspected Lymes dz . He did not go for labs as recommended in May  For Lyme titer. He denies muscle weakness, rash or neck stiffness  No speech issues.  No chest pain, orthopnea, edema or fever.  MW pt here for 4 week follow up. Pt is taking synthroid. Pt c/o fatigue and dry mouth.  rec Increase tsh to 75 mcg daily    11/28/2015  f/u ov/[name] re: hypothyroid Chief Complaint  Patient presents with  . Follow-up    Pt c/o restless sleep, insomnia and chronic fatigue. Pt's wife would like to have him checked for sleep apnea. Pt states that his symptoms have been increasingly worse since the tick bite earlier this year.   arthritis better,  Twice a week advil most of the time  Falls asleep fine but wakes up in 2-3 hours then reads x up to 30 min and after sev hours but does not feel refreshed in am  Better p benadryl but feels hungover  rec Tetanus shot today  Try trazodone 25 mg at bedtime    11/28/15 >  05/25/2016  Lots of phone calls/ lmtcb re trazadone side effects/ thyroid dose.   05/25/2016  f/u ov/[name] re: cpx  Chief Complaint  Patient presents with  . Annual Exam    Pt is fasting today. He states injured his left knee approx 6 wks  ago-seeing [facility]. No other new co's today.   limited by R knee  rec Increased synthroid to 100   11/25/2016  f/u ov/[name] re:  Hypothyroid / urinary hesitance/ no maint meds from urology yet  Chief Complaint  Patient presents with  . Follow-up    denies new co's  R knee surgery set for Dec 13th 2017 Dr Veverly Fells / min need for advil/ taking fish oil for joints  Variable decrease fos despite rx with cipro by urology rec f/u urology yearly due 06/2017    03/24/2017  f/u ov/[name] re:  Chief Complaint  Patient presents with  . Follow-up    Stopped synthroid 3-4 wks ago b/c he ran out and states he felt fine. He does c/o constipation, started "forever and a day ago".    mild fatigue, wt gain, constipation but these were chronic issues before stopped the synthroid  Not limited by breathing from desired activities   Variable decrease fos/ f/u by Ottelin but no notes in epic     05/27/2017  f/u ov/[name] re: hypothyroid / urinary hesitance  Chief Complaint  Patient presents with  . Annual Exam    Pt is fasting.  Still has constipation that comes and goes. He states that he  feels very tired all of the time.    due for f/u urology 06/2017 Overdue for colonoscopy 03/2007 last study  Not limited by breathing from desired activities  Main complaint is fatigue ? Worse off synthroid (he denies)   rec If Dr Karsten Ro doesn't call you before 07/27/17 for follow up then you should call him  Please see patient coordinator before you leave today  to schedule GI eval/ colonoscopy due Advil can be taken up to 3-4 with meals as needed for joint pain Please remember to go to the lab department downstairs in the basement  for your tests - we will call you with the results when they are available. Please schedule a follow up visit in 6 months but call sooner if needed Add:  tsh 12 so started back on 100 mcg / day and f/u in 6 weeks with goal tsh < 2         05/29/2018  f/u ov/[name] re:  cpx  Hypothryoid/urinary hesitance/  Chief Complaint  Patient presents with  . Annual Exam    Pt is fasting. He is doing well and no new co's.   Dyspnea:  Not limited Cough: no  Sleep: fine  SABA use:  None   No obvious day to day or daytime variability or assoc excess/ purulent sputum or mucus plugs or hemoptysis or cp or chest tightness, subjective wheeze or overt sinus or hb symptoms. No unusual exposure hx or h/o childhood pna/ asthma or knowledge of premature birth.  Sleeping    without nocturnal  or early am exacerbation  of respiratory  c/o's or need for noct saba. Also denies any obvious fluctuation of symptoms with weather or environmental changes or other aggravating or alleviating factors except as outlined above   Current Allergies, Complete Past Medical History, Past Surgical History, Family History, and Social History were reviewed in [employer] record.  ROS  The following are not active complaints unless bolded Hoarseness, sore throat, dysphagia, dental problems, itching, sneezing,  nasal congestion or discharge of excess mucus or purulent secretions, ear ache,   fever, chills, sweats, unintended wt loss or wt gain, classically pleuritic or exertional cp,  orthopnea pnd or arm/hand swelling  or leg swelling, presyncope, palpitations, abdominal pain, anorexia, nausea, vomiting, diarrhea  or change in bowel habits or change in bladder habits, change in stools or change in urine, dysuria, hematuria,  rash, arthralgias, visual complaints, headache, numbness, weakness or ataxia or problems with walking or coordination,  change in mood or  memory.        Current Meds  Medication Sig  . clotrimazole-betamethasone (LOTRISONE) cream Apply 1 application topically 2 (two) times daily. Apply twice daily as needed  . ibuprofen (ADVIL,MOTRIN) 200 MG tablet Take 200 mg by mouth as needed.    Marland Kitchen levothyroxine (SYNTHROID) 125 MCG tablet Take 1 tablet (125 mcg total) by mouth  daily before breakfast.  . polyethylene glycol powder (GLYCOLAX/MIRALAX) powder Take 17 grams  1 to 2 times a day . Titrate as needed.            Past Medical History:  HYPERLIPIDEMIA (ICD-272.4)  - target LDL < 130 based on positive family history and remote smoking hx  CHRONIC RHINITIS (ICD-472.0)  PALPITATIONS, HX OF (ICD-V12.50)  DIVERTICULOSIS........................................................................Marland Kitchen     [facility] (was [name])  - See colonoscopy 04/26/07  neck pain positive MRI with right C6/7 protrusion ......................Marland Kitchen      Dr. Ninfa Linden  Urinary Hestiance/ decrease FOS  2017 .........................................  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  has children  Patient states former smoker, light, quit 1993  Occ ETOH        Objective:   Physical Exam  amb wm nad   Vital signs reviewed - Note on arrival 02 sats  97% on RA       Wt 224 07/27/2011 > 01/19/2013  216 > 04/15/14 224   > 06/12/2014 227 >228 05/27/2015 > 06/28/2015  227>228 07/22/2015 > 11/28/2015  223 > 05/25/2016  228 >  03/24/2017  232 > 05/27/2017  234 >  01/06/2018  231 > 05/29/2018  235       HEENT: nl dentition, turbinates bilaterally, and oropharynx.Both ear canals caked hard wax    NECK :  without JVD/Nodes/TM/ nl carotid upstrokes bilaterally   LUNGS: no acc muscle use,  Nl contour chest which is clear to A and P bilaterally without cough on insp or exp maneuvers   CV:  RRR  no s3 or murmur or increase in P2, and no edema / strong dp  pulses bilaterally   ABD:  soft and nontender with nl inspiratory excursion in the supine position. No bruits or organomegaly appreciated, bowel sounds nl  MS:  Nl gait/ ext warm without deformities, calf tenderness, cyanosis or clubbing No obvious joint restrictions   SKIN: warm and dry without lesions    NEURO:  alert, approp, nl sensorium with  no motor or cerebellar deficits apparent.   GU/ rectal per urology      ekg 05/29/2018   = SB/ otherwise wnl    Labs ordered/ reviewed:      Chemistry      Component Value Date/Time   NA 139 05/29/2018 0941   K 4.1 05/29/2018 0941   CL 106 05/29/2018 0941   CO2 25 05/29/2018 0941   BUN 17 05/29/2018 0941   CREATININE 0.79 05/29/2018 0941      Component Value Date/Time   CALCIUM 9.0 05/29/2018 0941   ALKPHOS 69 05/29/2018 0941   AST 29 05/29/2018 0941   ALT 39 05/29/2018 0941   BILITOT 0.6 05/29/2018 0941        Lab Results  Component Value Date   WBC 5.2 05/29/2018   HGB 14.6 05/29/2018   HCT 41.9 05/29/2018   MCV 90.9 05/29/2018   PLT 208.0 05/29/2018      Lab Results  Component Value Date   TSH 3.75 05/29/2018               Assessment:

## 2018-05-29 NOTE — Progress Notes (Signed)
Spoke with pt and notified of results per Dr. [name]. Pt verbalized understanding and denied any questions. 

## 2018-05-29 NOTE — Patient Instructions (Addendum)
Please remember to go to the lab department downstairs in the basement  for your tests - we will call you with the results when they are available.  See Tammy NP for ear wax removal after you try the over the counter kit first       Please schedule a follow up visit in 6  months but call sooner if needed  

## 2018-06-05 ENCOUNTER — Encounter: Payer: Self-pay | Admitting: Internal Medicine

## 2018-06-05 DIAGNOSIS — H6123 Impacted cerumen, bilateral: Secondary | ICD-10-CM | POA: Insufficient documentation

## 2018-06-05 NOTE — Assessment & Plan Note (Signed)
Synthroid 50 mcg per day started 06/25/15 > increased to 75 mcg per day 09/03/15  - TSH 05/25/2016  = 4.8 on 75 mcg per day > increased to 100 mcg per day > 11/25/2016 =3.02  - stopped synthroid about 02/24/17 > seen 03/24/2017 and euthyroid clinically so ok to leave off unless symptoms  - TSH 12 05/27/2017 > resume synthroid 100 and recheck in 6 weeks > returned 11/28/2017 =  8.37 so rec 125 mcg daily and f/u by 01/27/18 > tsh 05/29/2018 =3.75   Adequate control on present rx, reviewed in detail with pt > no change in rx needed

## 2018-06-05 NOTE — Assessment & Plan Note (Signed)
Noted 05/29/2018 rec otc counters/ f/u with NP prn for lavage

## 2018-06-05 NOTE — Assessment & Plan Note (Signed)
-   target LDL < 130 based on positive family history and remote smoking hx     Lab Results  Component Value Date   CHOL 186 05/29/2018   HDL 33.00 (L) 05/29/2018   LDLCALC 125 (H) 05/29/2018   LDLDIRECT 138.7 01/19/2013   TRIG 140.0 05/29/2018   CHOLHDL 6 05/29/2018    Adequate control on present rx, reviewed in detail with pt > no change in rx needed  = diet/ ex

## 2018-06-05 NOTE — Assessment & Plan Note (Signed)
-   pt declined PSA screening 01/20/12 and 06/12/2014 and 05/25/2016 > per Ottelin   All other items are up to date

## 2018-08-07 ENCOUNTER — Other Ambulatory Visit: Payer: Managed Care, Other (non HMO)

## 2018-08-07 ENCOUNTER — Encounter: Payer: Self-pay | Admitting: Pulmonary Disease

## 2018-08-07 ENCOUNTER — Telehealth: Payer: Self-pay | Admitting: Pulmonary Disease

## 2018-08-07 ENCOUNTER — Ambulatory Visit: Payer: Managed Care, Other (non HMO) | Admitting: Pulmonary Disease

## 2018-08-07 VITALS — BP 130/88 | HR 73 | Temp 97.8°F | Ht 75.0 in | Wt 232.0 lb

## 2018-08-07 DIAGNOSIS — H6123 Impacted cerumen, bilateral: Secondary | ICD-10-CM | POA: Diagnosis not present

## 2018-08-07 DIAGNOSIS — J029 Acute pharyngitis, unspecified: Secondary | ICD-10-CM | POA: Diagnosis not present

## 2018-08-07 NOTE — Telephone Encounter (Signed)
Spoke with patient. He was seen by BM today for a sore throat. He was calling back because he was not sure if Aaron Edelman had called in any medications for him. Advised her that per the AVS, Aaron Edelman did not call in any medications. Patient verbalized understanding.   Nothing further needed during time of call.

## 2018-08-07 NOTE — Progress Notes (Signed)
@Patient  ID: Joel Franco, male    DOB: 07-06-1956, 62 y.o.   MRN: [id number]  Chief Complaint  Patient presents with  . Acute Visit    ST x 4days    Referring provider: Tanda Rockers, MD  HPI: 62 year old male remote smoker followed in office for internal medicine Past medical history: Chronic palpitations, hypothyroidism  08/07/18 Acute 62 year old patient presents today with 4 days of sore throat.  Patient reports past history of strep throat.  Patient reports he feels like he has strep throat.  Patient wanted to present today with concerns of being around his granddaughter if he sick.  Patient reports that symptoms started to really worsen today.  No known sick contacts.   Allergies  Allergen Reactions  . Amoxicillin     Immunization History  Administered Date(s) Administered  . Influenza Whole 01/12/2013  . Pneumococcal Polysaccharide-23 01/21/2010  . Tdap 11/28/2015    Past Medical History:  Diagnosis Date  . Chronic rhinitis   . Diverticulosis   . Other and unspecified hyperlipidemia   . Personal history of unspecified circulatory disease     Tobacco History: Social History   Tobacco Use  Smoking Status Former Smoker  . Types: Cigarettes  . Last attempt to quit: 12/28/1991  . Years since quitting: 26.6  Smokeless Tobacco Never Used  Tobacco Comment   "smoked on and off only occ"   Counseling given: Not Answered Comment: "smoked on and off only occ" We recommend he stop smoking.  Outpatient Encounter Medications as of 08/07/2018  Medication Sig  . clotrimazole-betamethasone (LOTRISONE) cream Apply 1 application topically 2 (two) times daily. Apply twice daily as needed  . ibuprofen (ADVIL,MOTRIN) 200 MG tablet Take 200 mg by mouth as needed.    Marland Kitchen levothyroxine (SYNTHROID) 125 MCG tablet Take 1 tablet (125 mcg total) by mouth daily before breakfast.  . polyethylene glycol powder (GLYCOLAX/MIRALAX) powder Take 17 grams  1 to 2 times a day . Titrate as  needed.   No facility-administered encounter medications on file as of 08/07/2018.      Review of Systems  Review of Systems  Constitutional: Positive for chills and fatigue. Negative for activity change, fever and unexpected weight change.  HENT: Positive for sore throat. Negative for postnasal drip, rhinorrhea, sinus pressure, sinus pain and sneezing.        Lump in throat feeling  Respiratory: Negative for cough, shortness of breath and wheezing.   Cardiovascular: Negative for chest pain and palpitations.  Gastrointestinal: Negative for constipation, diarrhea, nausea and vomiting.  Genitourinary: Negative for hematuria and urgency.  Musculoskeletal: Negative for arthralgias.  Skin: Negative for color change.  Allergic/Immunologic: Positive for environmental allergies.  Neurological: Negative for dizziness and headaches.  Psychiatric/Behavioral: Negative for dysphoric mood. The patient is not nervous/anxious.   All other systems reviewed and are negative.    Physical Exam  BP 130/88   Pulse 73   Temp 97.8 F (36.6 C) (Oral)   Ht 6\' 3"  (1.905 m)   Wt 232 lb (105.2 kg)   SpO2 98%   BMI 29.00 kg/m    Temp: 97 6  Wt Readings from Last 5 Encounters:  08/07/18 232 lb (105.2 kg)  05/29/18 235 lb (106.6 kg)  01/06/18 231 lb (104.8 kg)  11/28/17 231 lb 6.4 oz (105 kg)  10/13/17 227 lb (103 kg)     Physical Exam  Constitutional: He is oriented to person, place, and time and well-developed, well-nourished, and in no distress. Vital  signs are normal. No distress.  HENT:  Head: Normocephalic and atraumatic.  Right Ear: Hearing and external ear normal.  Left Ear: Hearing and external ear normal.  Mouth/Throat: Uvula is midline. Oropharyngeal exudate, posterior oropharyngeal edema and posterior oropharyngeal erythema present. No tonsillar abscesses.  Unable to visualize TMs bilaterally due to cerumen.  Eyes: Pupils are equal, round, and reactive to light.  Neck: Normal range  of motion. Neck supple. No JVD present.  Cardiovascular: Normal rate, regular rhythm and normal heart sounds.  Pulmonary/Chest: Effort normal and breath sounds normal. No accessory muscle usage. No respiratory distress. He has no decreased breath sounds. He has no wheezes. He has no rhonchi. He has no rales.  Musculoskeletal: Normal range of motion.  Lymphadenopathy:       Head (right side): Submandibular and tonsillar adenopathy present.       Head (left side): Submandibular adenopathy present.    He has no cervical adenopathy.  Neurological: He is alert and oriented to person, place, and time. Gait normal.  Skin: Skin is warm and dry. He is not diaphoretic. No erythema.  Psychiatric: Mood, memory, affect and judgment normal.  Nursing note and vitals reviewed.    Lab Results:  CBC    Component Value Date/Time   WBC 5.2 05/29/2018 0941   RBC 4.61 05/29/2018 0941   HGB 14.6 05/29/2018 0941   HCT 41.9 05/29/2018 0941   PLT 208.0 05/29/2018 0941   MCV 90.9 05/29/2018 0941   MCHC 34.9 05/29/2018 0941   RDW 12.9 05/29/2018 0941   LYMPHSABS 1.4 05/29/2018 0941   MONOABS 0.5 05/29/2018 0941   EOSABS 0.4 05/29/2018 0941   BASOSABS 0.0 05/29/2018 0941    BMET    Component Value Date/Time   NA 139 05/29/2018 0941   K 4.1 05/29/2018 0941   CL 106 05/29/2018 0941   CO2 25 05/29/2018 0941   GLUCOSE 107 (H) 05/29/2018 0941   BUN 17 05/29/2018 0941   CREATININE 0.79 05/29/2018 0941   CALCIUM 9.0 05/29/2018 0941   GFRNONAA 107.51 01/21/2010 1105   GFRAA 131 04/26/2008 0000    BNP No results found for: BNP  ProBNP    Component Value Date/Time   PROBNP 18.0 06/12/2014 0939    Imaging: No results found.   Assessment & Plan:   Pleasant 62 year old patient seen today for office visit.  Patient with pharyngitis will send to lab for swab and culture.  Excessive ear wax, bilateral Present on exam today Encouraged patient to schedule follow-up visit for lavage Also  discussed Debrox eardrops  Sore throat Proceed to the lab for strep test  Can also do salt water rinses to help with symptom relief Can also do Chloraseptic Spray   This appointment was 26 minutes along with her 50% of the time in direct face-to-face patient care, assessment, plan of care follow-up.   Lauraine Rinne, NP 08/07/2018

## 2018-08-07 NOTE — Assessment & Plan Note (Signed)
Present on exam today Encouraged patient to schedule follow-up visit for lavage Also discussed Debrox eardrops

## 2018-08-07 NOTE — Assessment & Plan Note (Signed)
Proceed to the lab for strep test  Can also do salt water rinses to help with symptom relief Can also do Chloraseptic Spray

## 2018-08-07 NOTE — Patient Instructions (Addendum)
Proceed to the lab for strep test  Can also do salt water rinses to help with symptom relief Can also do Chloraseptic Spray     Please contact the office if your symptoms worsen or you have concerns that you are not improving.   Thank you for choosing [facility] for your healthcare, and for allowing [place] to partner with you on your healthcare journey. I am thankful to be able to provide care to you today.   Wyn Quaker FNP-C      Pharyngitis Pharyngitis is a sore throat (pharynx). There is redness, pain, and swelling of your throat. Follow these instructions at home:  Drink enough fluids to keep your pee (urine) clear or pale yellow.  Only take medicine as told by your doctor. ? You may get sick again if you do not take medicine as told. Finish your medicines, even if you start to feel better. ? Do not take aspirin.  Rest.  Rinse your mouth (gargle) with salt water ( tsp of salt per 1 qt of water) every 1-2 hours. This will help the pain.  If you are not at risk for choking, you can suck on hard candy or sore throat lozenges. Contact a doctor if:  You have large, tender lumps on your neck.  You have a rash.  You cough up green, yellow-brown, or bloody spit. Get help right away if:  You have a stiff neck.  You drool or cannot swallow liquids.  You throw up (vomit) or are not able to keep medicine or liquids down.  You have very bad pain that does not go away with medicine.  You have problems breathing (not from a stuffy nose). This information is not intended to replace advice given to you by your health care provider. Make sure you discuss any questions you have with your health care provider. Document Released: 05/31/2008 Document Revised: 05/20/2016 Document Reviewed: 08/20/2013 Elsevier Interactive Patient Education  2017 [employer].

## 2018-08-07 NOTE — Progress Notes (Signed)
Chart and office note reviewed in detail  > agree with a/p as outlined    

## 2018-08-08 ENCOUNTER — Ambulatory Visit: Payer: Managed Care, Other (non HMO) | Admitting: Adult Health

## 2018-08-09 ENCOUNTER — Telehealth: Payer: Self-pay | Admitting: Pulmonary Disease

## 2018-08-09 ENCOUNTER — Other Ambulatory Visit: Payer: Self-pay | Admitting: Pulmonary Disease

## 2018-08-09 NOTE — Telephone Encounter (Signed)
Called and spoke with patient, made patient aware that lab informed this office that they were unable to locate his swab for strep screen. Patient stated at this time he was no longer symptomatic and is feeling significantly better. Patient states he did not feel he needed to be re-swabbed at this time. Patient was informed that if he started to be symptomatic again to give this office a call and we would have him come in for a visit. Patient voiced understanding. Nothing further needed at this time.

## 2018-08-10 LAB — CULTURE, GROUP A STREP: STREP A CULTURE: NEGATIVE

## 2018-08-11 NOTE — Progress Notes (Signed)
Strep a culture negative.   Wyn Quaker FNP

## 2018-08-17 ENCOUNTER — Ambulatory Visit: Payer: Managed Care, Other (non HMO) | Admitting: Primary Care

## 2018-08-17 ENCOUNTER — Encounter: Payer: Self-pay | Admitting: Primary Care

## 2018-08-17 DIAGNOSIS — H6123 Impacted cerumen, bilateral: Secondary | ICD-10-CM

## 2018-08-17 NOTE — Patient Instructions (Signed)
  Bilateral ear lavage today with successful cerumen removal Return as needed

## 2018-08-17 NOTE — Assessment & Plan Note (Addendum)
-   Bilateral ear lavage today with successful removal of impacted cerumen (large amount dark yellow/brown cerumen approx 2-86mm) - After procedure TMs visualized, normal. Canals clear.  - Patient reports improvement in hearing  - Educated patient that he may have some residual tinnitus today from procedure, move slowly.  - Return as needed

## 2018-08-17 NOTE — Progress Notes (Signed)
@Patient  ID: Joel Franco, male    DOB: 01/05/1956, 62 y.o.   MRN: [id number]  Chief Complaint  Patient presents with  . Acute Visit    ear wax removal    Referring provider: Tanda Rockers, MD  HPI: 62 year old male, former smoker. PMH tinnitus. Patient of Dr. Melvyn Novas, last seen by Wyn Quaker, NP on 8/12 for sore throat.   08/17/2018 Patient presents today for bilateral cerumen impaction. Underwent bilateral ear lavage in office with successful removal of cerumen. Tolerated procedure well. Bilateral TMs visualized, ear canals clear. Patient reported improvement in hearing.   Allergies  Allergen Reactions  . Amoxicillin     Immunization History  Administered Date(s) Administered  . Influenza Whole 01/12/2013  . Pneumococcal Polysaccharide-23 01/21/2010  . Tdap 11/28/2015    Past Medical History:  Diagnosis Date  . Chronic rhinitis   . Diverticulosis   . Other and unspecified hyperlipidemia   . Personal history of unspecified circulatory disease     Tobacco History: Social History   Tobacco Use  Smoking Status Former Smoker  . Types: Cigarettes  . Last attempt to quit: 12/28/1991  . Years since quitting: 26.6  Smokeless Tobacco Never Used  Tobacco Comment   "smoked on and off only occ"   Counseling given: Not Answered Comment: "smoked on and off only occ"   Outpatient Medications Prior to Visit  Medication Sig Dispense Refill  . clotrimazole-betamethasone (LOTRISONE) cream Apply 1 application topically 2 (two) times daily. Apply twice daily as needed 30 g 5  . ibuprofen (ADVIL,MOTRIN) 200 MG tablet Take 200 mg by mouth as needed.      Marland Kitchen levothyroxine (SYNTHROID) 125 MCG tablet Take 1 tablet (125 mcg total) by mouth daily before breakfast. 30 tablet 11  . polyethylene glycol powder (GLYCOLAX/MIRALAX) powder Take 17 grams  1 to 2 times a day . Titrate as needed. 255 g 0   No facility-administered medications prior to visit.     Review of Systems  Review of  Systems  Constitutional: Negative.   HENT: Positive for ear pain, hearing loss and tinnitus.   Respiratory: Negative.    Physical Exam  BP 134/80 (BP Location: Left Arm, Cuff Size: Normal)   Pulse 85   Ht 6\' 2"  (1.88 m)   Wt 230 lb 6.4 oz (104.5 kg)   SpO2 98%   BMI 29.58 kg/m  Physical Exam  Constitutional: Vital signs are normal. He appears well-developed and well-nourished. No distress.  HENT:  Right Ear: Hearing, tympanic membrane, external ear and ear canal normal. Tympanic membrane is not perforated, not erythematous and not bulging.  Left Ear: Hearing, tympanic membrane, external ear and ear canal normal. Tympanic membrane is not perforated, not erythematous and not bulging.  Eyes: Pupils are equal, round, and reactive to light. EOM are normal.  Neck: Normal range of motion. Neck supple.  Cardiovascular: Normal rate.  Pulmonary/Chest: Effort normal.  Musculoskeletal: Normal range of motion.  Skin: Skin is warm and dry.  Psychiatric: He has a normal mood and affect. His behavior is normal. Judgment and thought content normal.     Lab Results:  CBC    Component Value Date/Time   WBC 5.2 05/29/2018 0941   RBC 4.61 05/29/2018 0941   HGB 14.6 05/29/2018 0941   HCT 41.9 05/29/2018 0941   PLT 208.0 05/29/2018 0941   MCV 90.9 05/29/2018 0941   MCHC 34.9 05/29/2018 0941   RDW 12.9 05/29/2018 0941   LYMPHSABS 1.4 05/29/2018  0941   MONOABS 0.5 05/29/2018 0941   EOSABS 0.4 05/29/2018 0941   BASOSABS 0.0 05/29/2018 0941    BMET    Component Value Date/Time   NA 139 05/29/2018 0941   K 4.1 05/29/2018 0941   CL 106 05/29/2018 0941   CO2 25 05/29/2018 0941   GLUCOSE 107 (H) 05/29/2018 0941   BUN 17 05/29/2018 0941   CREATININE 0.79 05/29/2018 0941   CALCIUM 9.0 05/29/2018 0941   GFRNONAA 107.51 01/21/2010 1105   GFRAA 131 04/26/2008 0000    BNP No results found for: BNP  ProBNP    Component Value Date/Time   PROBNP 18.0 06/12/2014 0939    Imaging: No  results found.   Assessment & Plan:   Excessive ear wax, bilateral - Bilateral ear lavage today with successful removal of impacted cerumen (large amount dark yellow/brown cerumen approx 2-77mm) - After procedure TMs visualized, normal. Canals clear.  - Patient reports improvement in hearing  - Educated patient that he may have some residual tinnitus today from procedure, move slowly.  - Return as needed      Martyn Ehrich, NP 08/17/2018

## 2018-08-21 NOTE — Progress Notes (Signed)
Chart and office note reviewed in detail  > agree with a/p as outlined    

## 2018-11-28 ENCOUNTER — Other Ambulatory Visit (INDEPENDENT_AMBULATORY_CARE_PROVIDER_SITE_OTHER): Payer: Managed Care, Other (non HMO)

## 2018-11-28 ENCOUNTER — Ambulatory Visit (INDEPENDENT_AMBULATORY_CARE_PROVIDER_SITE_OTHER): Payer: Managed Care, Other (non HMO) | Admitting: Internal Medicine

## 2018-11-28 ENCOUNTER — Encounter: Payer: Self-pay | Admitting: Internal Medicine

## 2018-11-28 ENCOUNTER — Other Ambulatory Visit: Payer: Self-pay | Admitting: Internal Medicine

## 2018-11-28 VITALS — BP 120/80 | HR 64 | Ht 74.0 in | Wt 232.0 lb

## 2018-11-28 DIAGNOSIS — M25552 Pain in left hip: Secondary | ICD-10-CM

## 2018-11-28 DIAGNOSIS — G4700 Insomnia, unspecified: Secondary | ICD-10-CM

## 2018-11-28 DIAGNOSIS — M25561 Pain in right knee: Secondary | ICD-10-CM

## 2018-11-28 DIAGNOSIS — H6123 Impacted cerumen, bilateral: Secondary | ICD-10-CM

## 2018-11-28 DIAGNOSIS — E039 Hypothyroidism, unspecified: Secondary | ICD-10-CM

## 2018-11-28 DIAGNOSIS — M25551 Pain in right hip: Secondary | ICD-10-CM

## 2018-11-28 DIAGNOSIS — G8929 Other chronic pain: Secondary | ICD-10-CM

## 2018-11-28 DIAGNOSIS — M25562 Pain in left knee: Secondary | ICD-10-CM

## 2018-11-28 LAB — TSH: TSH: 9.95 u[IU]/mL — AB (ref 0.35–4.50)

## 2018-11-28 MED ORDER — LEVOTHYROXINE SODIUM 150 MCG PO TABS: 150.0000 ug | ORAL_TABLET | Freq: Every day | ORAL | 11 refills | Status: DC

## 2018-11-28 NOTE — Patient Instructions (Addendum)
Please remember to go to the lab department   for your tests - we will call you with the results when they are available.   Please schedule a follow up visit in 6 months but call sooner if needed for CPX

## 2018-11-28 NOTE — Assessment & Plan Note (Addendum)
Synthroid 50 mcg per day started 06/25/15 > increased to 75 mcg per day 09/03/15  - TSH 05/25/2016  = 4.8 on 75 mcg per day > increased to 100 mcg per day > 11/25/2016 =3.02  - stopped synthroid about 02/24/17 > seen 03/24/2017 and euthyroid clinically so ok to leave off unless symptoms  - TSH 12 05/27/2017 > resume synthroid 100 and recheck in 6 weeks > returned 11/28/2017 =  8.37 so rec 125 mcg daily and f/u by 01/27/18 > tsh 05/29/2018 =3.75 on 125 /day > no change rx     He is clinically euthyroid.  He has the notion that if he does not feel symptomatic it's ok to ad lib on taking meds and I strongly advised that we focus on the TSH and make sure it is normalized to prevent problems with bone metabolism or hyper lipidemia or chronic constipation.  As his TSH  has climbed despite stating he is on 125 mcg/day daily before bfast  I recommended that he increase to 150 mcg/day and return in 6 weeks for follow-up.

## 2018-11-28 NOTE — Progress Notes (Signed)
Spoke with pt and notified of results per Dr. [name]. Pt verbalized understanding and denied any questions. 

## 2018-11-28 NOTE — Progress Notes (Signed)
Subjective:     Patient ID: Joel Franco, male   DOB: [birth year]     MRN: [id number]    Brief patient profile:  32 yowm remote minimal smoker with chronic palpitations that typically occur more at rest than they do with activity and not progressive, if anything less noticeable as he has aged .   History of Present Illness  June 12, 2009 cpx no urinary problems, overall doing fine except for problems staying asleep. rec sleep hygiene reviewed    07/22/2015 NP Follow up : Hypothyroid  Pt returns for evaluation .  Seen last ov with fatigue  Labs showed hypothyroidism with TSH ~8.47  He was started on Synthroid 66mcg.  He is starting to feel better. Less fatigue and joint pains.  Had tick bite with rash in May , given 3 weeks of Doxycycline for  Suspected Lymes dz . He did not go for labs as recommended in May  For Lyme titer. He denies muscle weakness, rash or neck stiffness  No speech issues.  No chest pain, orthopnea, edema or fever.  MW pt here for 4 week follow up. Pt is taking synthroid. Pt c/o fatigue and dry mouth.  rec Increase tsh to 75 mcg daily    11/28/2015  f/u ov/Demarri Elie re: hypothyroid Chief Complaint  Patient presents with  . Follow-up    Pt c/o restless sleep, insomnia and chronic fatigue. Pt's wife would like to have him checked for sleep apnea. Pt states that his symptoms have been increasingly worse since the tick bite earlier this year.   arthritis better,  Twice a week advil most of the time  Falls asleep fine but wakes up in 2-3 hours then reads x up to 30 min and after sev hours but does not feel refreshed in am  Better p benadryl but feels hungover  rec Tetanus shot today  Try trazodone 25 mg at bedtime    11/28/15 >  05/25/2016  Lots of phone calls/ lmtcb re trazadone side effects/ thyroid dose.   05/25/2016  f/u ov/Topacio Cella re: cpx  Chief Complaint  Patient presents with  . Annual Exam    Pt is fasting today. He states injured his left knee approx 6 wks  ago-seeing [facility]. No other new co's today.   limited by R knee  rec Increased synthroid to 100    05/29/2018  f/u ov/Ethal Gotay re:  cpx Hypothryoid/urinary hesitance f/u by [employer]  Patient presents with  . Annual Exam    Pt is fasting. He is doing well and no new co's.   Dyspnea:  Not limited Cough: no  rec No change rx    11/28/2018  f/u ov/Toluwani Ruder re: hypothyroid/ ear wax/ arthritis  Chief Complaint  Patient presents with  . Follow-up    He states having trouble staying asleep.   Dyspnea:  Not limited by breathing from desired activities  - gym at least twice a week, cardio x 30 min  Cough: none Sleeping: insomnia - wakes up every 3-4 hours and can't go back to sleep so watches tv Mild constipation does not appear to correlate with thyroid dose   No obvious day to day or daytime variability or assoc excess/ purulent sputum or mucus plugs or hemoptysis or cp or chest tightness, subjective wheeze or overt sinus or hb symptoms.   Sleeping flat as above  without nocturnal  or early am exacerbation  of respiratory  c/o's or need for noct saba. Also denies  any obvious fluctuation of symptoms with weather or environmental changes or other aggravating or alleviating factors except as outlined above   No unusual exposure hx or h/o childhood pna/ asthma or knowledge of premature birth.  Current Allergies, Complete Past Medical History, Past Surgical History, Family History, and Social History were reviewed in [employer] record.  ROS  The following are not active complaints unless bolded Hoarseness, sore throat, dysphagia, dental problems, itching, sneezing,  nasal congestion or discharge of excess mucus or purulent secretions, ear ache,   fever, chills, sweats, unintended wt loss or wt gain, classically pleuritic or exertional cp,  orthopnea pnd or arm/hand swelling  or leg swelling, presyncope, palpitations, abdominal pain, anorexia, nausea, vomiting,  diarrhea  or change in bowel habits or change in bladder habits, change in stools or change in urine, dysuria, hematuria,  rash, arthralgias better no need for nsaid, visual complaints, headache, numbness, weakness or ataxia or problems with walking or coordination,  change in mood or  memory.        Current Meds  Medication Sig  . clotrimazole-betamethasone (LOTRISONE) cream Apply 1 application topically 2 (two) times daily. Apply twice daily as needed  . diphenhydrAMINE HCl, Sleep, (SLEEP-AID PO) Take 1 tablet by mouth at bedtime as needed.  Marland Kitchen ibuprofen (ADVIL,MOTRIN) 200 MG tablet Take 200 mg by mouth as needed.    Marland Kitchen levothyroxine (SYNTHROID) 125 MCG tablet Take 1 tablet (125 mcg total) by mouth daily before breakfast.  . polyethylene glycol powder (GLYCOLAX/MIRALAX) powder Take 17 grams  1 to 2 times a day . Titrate as needed.        Past Medical History:  HYPERLIPIDEMIA (ICD-272.4)  - target LDL < 130 based on positive family history and remote smoking hx  CHRONIC RHINITIS (ICD-472.0)  PALPITATIONS, HX OF (ICD-V12.50)  DIVERTICULOSIS........................................................................Marland Kitchen     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 Ninfa Linden  Urinary Hestiance/ decrease FOS 2017 .........................................  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   ear canals without cough reflex/ min wax bilaterally    NECK :  without JVD/Nodes/TM/ nl carotid upstrokes bilaterally   LUNGS: no acc muscle use,  Nl contour chest which is clear to A and P bilaterally without cough on insp or exp maneuvers   CV:  RRR  no s3 or murmur or increase in P2, and no edema   ABD:  soft  and nontender with nl inspiratory excursion in the supine position. No bruits or organomegaly appreciated, bowel sounds nl  MS:  Nl gait/ ext warm without deformities, calf tenderness, cyanosis or clubbing No obvious joint restrictions   SKIN: warm and dry without lesions/ palms not sweaty/ hair nl    NEURO:  alert, approp, nl sensorium with  no motor or cerebellar deficits apparent. No tremor, nl reflexes        Labs ordered 11/28/2018   Lab Results  Component Value Date   TSH 9.95 (H) 11/28/2018                       Assessment:

## 2018-11-29 ENCOUNTER — Encounter: Payer: Self-pay | Admitting: Internal Medicine

## 2018-11-29 NOTE — Assessment & Plan Note (Signed)
Discussed this issue again with him today specifically related to the watching of TV late at night or in the middle of the night may very well contribute to daytime hypersomnolence but so far this has not been a problem.  He did not do well on trazodone chronically and so if no further medications are recommended at this point.

## 2018-11-29 NOTE — Assessment & Plan Note (Signed)
Noted 05/29/2018 rec otc counters/ f/u with NP prn for lavage > resolved   His ears remain wax free using the over-the-counter medications that previously were recommended.

## 2018-11-29 NOTE — Assessment & Plan Note (Signed)
Reviewed the appropriate use of nonsteroidals including the importance of taking them with food and let me know if his need for them goes up in which case I will be referring him to an orthopedic physician.

## 2018-12-28 ENCOUNTER — Ambulatory Visit (INDEPENDENT_AMBULATORY_CARE_PROVIDER_SITE_OTHER): Payer: Managed Care, Other (non HMO) | Admitting: Nurse Practitioner

## 2018-12-28 ENCOUNTER — Encounter: Payer: Self-pay | Admitting: Nurse Practitioner

## 2018-12-28 ENCOUNTER — Telehealth: Payer: Self-pay | Admitting: Internal Medicine

## 2018-12-28 VITALS — BP 132/70 | HR 68 | Temp 97.5°F | Ht 74.0 in | Wt 236.4 lb

## 2018-12-28 DIAGNOSIS — H9192 Unspecified hearing loss, left ear: Secondary | ICD-10-CM | POA: Diagnosis not present

## 2018-12-28 DIAGNOSIS — H9312 Tinnitus, left ear: Secondary | ICD-10-CM

## 2018-12-28 NOTE — Telephone Encounter (Signed)
Called and spoke with patient, he is aware and verbalized understanding. Patient has been scheduled to see TN this afternoon. Nothing further needed.

## 2018-12-28 NOTE — Patient Instructions (Signed)
Decreased hearing to right ear and complaints of ringing in ear Will place referral to ENT Continue current medications Keep upcoming appointment with Dr. Melvyn Novas as scheduled

## 2018-12-28 NOTE — Progress Notes (Signed)
@Patient  ID: Joel Franco, male    DOB: 1956/11/02, 63 y.o.   MRN: [id number]  Chief Complaint  Patient presents with  . Possible ruptured ear drum    Referring provider: Tanda Rockers, MD  HPI  63 year old male former smoker followed by Dr. Shyrl Numbers  Tests: CXR 12/29/12>> No pneumonia.  Peribronchial thickening may indicate bronchitis.  OV 12/29/18 - Decreased hearing Patient presents with decreased hearing and ringing in left ear.  States that he was hunting yesterday and when he shot his gun his left ear immediately started hurting and ringing from the sound.  He was in a [place] in the saline echo to very loudly.  He is concerned that he ruptured his eardrum.  He denies any drainage out of his ears.  He denies any fever.  Denies any recent sinus congestion.   Allergies  Allergen Reactions  . Amoxicillin     Immunization History  Administered Date(s) Administered  . Influenza Whole 01/12/2013  . Pneumococcal Polysaccharide-23 01/21/2010  . Tdap 11/28/2015    Past Medical History:  Diagnosis Date  . Chronic rhinitis   . Diverticulosis   . Other and unspecified hyperlipidemia   . Personal history of unspecified circulatory disease     Tobacco History: Social History   Tobacco Use  Smoking Status Former Smoker  . Types: Cigarettes  . Last attempt to quit: 12/28/1991  . Years since quitting: 27.0  Smokeless Tobacco Never Used  Tobacco Comment   "smoked on and off only occ"   Counseling given: Yes Comment: "smoked on and off only occ"   Outpatient Encounter Medications as of 12/28/2018  Medication Sig  . clotrimazole-betamethasone (LOTRISONE) cream Apply 1 application topically 2 (two) times daily. Apply twice daily as needed  . diphenhydrAMINE HCl, Sleep, (SLEEP-AID PO) Take 1 tablet by mouth at bedtime as needed.  Marland Kitchen ibuprofen (ADVIL,MOTRIN) 200 MG tablet Take 200 mg by mouth as needed.    Marland Kitchen levothyroxine (SYNTHROID) 150 MCG tablet Take 1 tablet (150 mcg total) by  mouth daily before breakfast.  . polyethylene glycol powder (GLYCOLAX/MIRALAX) powder Take 17 grams  1 to 2 times a day . Titrate as needed.   No facility-administered encounter medications on file as of 12/28/2018.      Review of Systems  Review of Systems  Constitutional: Negative.   HENT: Positive for ear pain (left) and hearing loss (left).   Respiratory: Negative for cough and shortness of breath.   Cardiovascular: Negative.   Gastrointestinal: Negative.   Allergic/Immunologic: Negative.   Neurological: Negative.   Psychiatric/Behavioral: Negative.        Physical Exam  BP 132/70 (BP Location: Left Arm, Patient Position: Sitting, Cuff Size: Normal)   Pulse 68   Temp (!) 97.5 F (36.4 C)   Ht 6\' 2"  (1.88 m)   Wt 236 lb 6.4 oz (107.2 kg)   SpO2 98%   BMI 30.35 kg/m   Wt Readings from Last 5 Encounters:  12/28/18 236 lb 6.4 oz (107.2 kg)  11/28/18 232 lb (105.2 kg)  08/17/18 230 lb 6.4 oz (104.5 kg)  08/07/18 232 lb (105.2 kg)  05/29/18 235 lb (106.6 kg)     Physical Exam Vitals signs and nursing note reviewed.  Constitutional:      General: He is not in acute distress.    Appearance: He is well-developed.  HENT:     Right Ear: Tympanic membrane normal. Decreased hearing noted. No drainage. There is no impacted cerumen.  Left Ear: Tympanic membrane normal. Decreased hearing noted. No drainage. There is no impacted cerumen.  Cardiovascular:     Rate and Rhythm: Normal rate and regular rhythm.  Pulmonary:     Effort: Pulmonary effort is normal.     Breath sounds: Normal breath sounds.  Skin:    General: Skin is warm and dry.  Neurological:     Mental Status: He is alert and oriented to person, place, and time.       Assessment & Plan:   Hearing loss of left ear Structurally patient's ear was normal. He did having some hearing loss noted with whisper test. Will refer to ENT for further evaluation.   Patient Instructions  Decreased hearing to  right ear and complaints of ringing in ear Will place referral to ENT Continue current medications Keep upcoming appointment with Dr. Melvyn Novas as scheduled     Tinnitus Patient Instructions  Decreased hearing to right ear and complaints of ringing in ear Will place referral to ENT Continue current medications Keep upcoming appointment with Dr. Melvyn Novas as scheduled       Fenton Foy, NP 12/29/2018

## 2018-12-28 NOTE — Telephone Encounter (Signed)
Called and spoke with patient, he stated that he went hunting yesterday and was sitting in a hollow. Patient used his rifle and once he made a shot it caused immediate ringing in the left ear with diminished hearing. Patient denies headache, no pain, and no discharge. Patient stated that his hearing in his left ear is more diminished today as if "my hearing has been turned down low". Patient is worried that he could have a ruptured ear drum and wants to know what he should do. MW please advise, thank you.

## 2018-12-28 NOTE — Telephone Encounter (Signed)
Let him see one of our NP's to see if ent eval needed or if he's established with one in past that would be fine also

## 2018-12-29 ENCOUNTER — Encounter: Payer: Self-pay | Admitting: Nurse Practitioner

## 2018-12-29 DIAGNOSIS — H9192 Unspecified hearing loss, left ear: Secondary | ICD-10-CM | POA: Insufficient documentation

## 2018-12-29 NOTE — Assessment & Plan Note (Signed)
Patient Instructions  Decreased hearing to right ear and complaints of ringing in ear Will place referral to ENT Continue current medications Keep upcoming appointment with Dr. Melvyn Novas as scheduled

## 2018-12-29 NOTE — Assessment & Plan Note (Signed)
Structurally patient's ear was normal. He did having some hearing loss noted with whisper test. Will refer to ENT for further evaluation.   Patient Instructions  Decreased hearing to right ear and complaints of ringing in ear Will place referral to ENT Continue current medications Keep upcoming appointment with Dr. Melvyn Novas as scheduled

## 2019-01-04 NOTE — Progress Notes (Signed)
Chart and office note reviewed in detail  > agree with a/p as outlined    

## 2019-05-10 ENCOUNTER — Other Ambulatory Visit: Payer: Self-pay

## 2019-05-10 ENCOUNTER — Ambulatory Visit (INDEPENDENT_AMBULATORY_CARE_PROVIDER_SITE_OTHER): Payer: Self-pay | Admitting: Internal Medicine

## 2019-05-10 ENCOUNTER — Encounter: Payer: Self-pay | Admitting: Internal Medicine

## 2019-05-10 VITALS — BP 128/84 | HR 62 | Temp 98.0°F | Ht 74.0 in | Wt 234.0 lb

## 2019-05-10 DIAGNOSIS — E039 Hypothyroidism, unspecified: Secondary | ICD-10-CM

## 2019-05-10 LAB — TSH: TSH: 4.6 u[IU]/mL — ABNORMAL HIGH (ref 0.35–4.50)

## 2019-05-10 NOTE — Progress Notes (Signed)
Subjective:     Patient ID: Joel Franco, male   DOB: 04-23-1956     MRN: [id number]    Brief patient profile:  75 yowm remote minimal smoker with chronic palpitations that typically occur more at rest than they do with activity and not progressive, if anything less noticeable as he has aged .   History of Present Illness  June 12, 2009 cpx no urinary problems, overall doing fine except for problems staying asleep. rec sleep hygiene reviewed    07/22/2015 NP Follow up : Hypothyroid  Pt returns for evaluation .  Seen last ov with fatigue  Labs showed hypothyroidism with TSH ~8.47  He was started on Synthroid 32mcg.  He is starting to feel better. Less fatigue and joint pains.  Had tick bite with rash in May , given 3 weeks of Doxycycline for  Suspected Lymes dz . He did not go for labs as recommended in May  For Lyme titer. He denies muscle weakness, rash or neck stiffness  No speech issues.  No chest pain, orthopnea, edema or fever.  MW pt here for 4 week follow up. Pt is taking synthroid. Pt c/o fatigue and dry mouth.  rec Increase tsh to 75 mcg daily    11/28/2015  f/u ov/Joel Franco re: hypothyroid Chief Complaint  Patient presents with  . Follow-up    Pt c/o restless sleep, insomnia and chronic fatigue. Pt's wife would like to have him checked for sleep apnea. Pt states that his symptoms have been increasingly worse since the tick bite earlier this year.   arthritis better,  Twice a week advil most of the time  Falls asleep fine but wakes up in 2-3 hours then reads x up to 30 min and after sev hours but does not feel refreshed in am  Better p benadryl but feels hungover  rec Tetanus shot today  Try trazodone 25 mg at bedtime    11/28/15 >  05/25/2016  Lots of phone calls/ lmtcb re trazadone side effects/ thyroid dose.   05/25/2016  f/u ov/Joel Franco re: cpx  Chief Complaint  Patient presents with  . Annual Exam    Pt is fasting today. He states injured his left knee approx 6 wks  ago-seeing [facility]. No other new co's today.   limited by R knee  rec Increased synthroid to 100   11/25/2016  f/u ov/Joel Franco re:  Hypothyroid / urinary hesitance/ no maint meds from urology yet  Chief Complaint  Patient presents with  . Follow-up    denies new co's  R knee surgery set for Dec 13th 2017 Dr Veverly Fells / min need for advil/ taking fish oil for joints  Variable decrease fos despite rx with cipro by urology rec f/u urology yearly due 06/2017    03/24/2017  f/u ov/Joel Franco re:  Chief Complaint  Patient presents with  . Follow-up    Stopped synthroid 3-4 wks ago b/c he ran out and states he felt fine. He does c/o constipation, started "forever and a day ago".    mild fatigue, wt gain, constipation but these were chronic issues before stopped the synthroid  Not limited by breathing from desired activities   Variable decrease fos/ f/u by [facility] but no notes in epic     05/27/2017  f/u ov/Joel Franco re: hypothyroid / urinary hesitance  Chief Complaint  Patient presents with  . Annual Exam    Pt is fasting.  Still has constipation that comes and goes. He states that he  feels very tired all of the time.    due for f/u urology 06/2017 Overdue for colonoscopy 03/2007 last study  Not limited by breathing from desired activities  Main complaint is fatigue ? Worse off synthroid (he denies)   rec If Dr Karsten Ro doesn't call you before 07/27/17 for follow up then you should call him  Please see patient coordinator before you leave today  to schedule GI eval/ colonoscopy due Advil can be taken up to 3-4 with meals as needed for joint pain Please remember to go to the lab department downstairs in the basement  for your tests - we will call you with the results when they are available. Please schedule a follow up visit in 6 months but call sooner if needed Add:  tsh 12 so started back on 100 mcg / day and f/u in 6 weeks with goal tsh < 2         05/29/2018  f/u ov/Joel Franco re:  cpx  Hypothryoid/urinary hesitance/  Chief Complaint  Patient presents with  . Annual Exam    Pt is fasting. He is doing well and no new co's.   Dyspnea:  Not limited Cough: no  Sleep: fine  SABA use:  None rec No change rx    05/10/2019  f/u ov/Joel Franco re: hypothyroid - wife lost job and insurance due to [place] Complaint  Patient presents with  . Annual Exam    Pt is fasting. He states he has lost his insurance since the last appt.   Dyspnea:  Not limited by breathing from desired activities   Cough: minimal assoc with pnds - not taking any meds  Sleeping: flat in bed SABA use: no 02: no    No obvious day to day or daytime variability or assoc excess/ purulent sputum or mucus plugs or hemoptysis or cp or chest tightness, subjective wheeze or overt sinus or hb symptoms.   Sleeping  without nocturnal  or early am exacerbation  of respiratory  c/o's or need for noct saba. Also denies any obvious fluctuation of symptoms with weather or environmental changes or other aggravating or alleviating factors except as outlined above   No unusual exposure hx or h/o childhood pna/ asthma or knowledge of premature birth.  Current Allergies, Complete Past Medical History, Past Surgical History, Family History, and Social History were reviewed in [employer] record.  ROS  The following are not active complaints unless bolded Hoarseness, sore throat, dysphagia, dental problems, itching, sneezing,  nasal congestion or discharge of excess mucus or purulent secretions, ear ache,   fever, chills, sweats, unintended wt loss or wt gain, classically pleuritic or exertional cp,  orthopnea pnd or arm/hand swelling  or leg swelling, presyncope, palpitations, abdominal pain, anorexia, nausea, vomiting, diarrhea  or change in bowel habits or change in bladder habits, change in stools or change in urine, dysuria, hematuria,  rash, arthralgias, visual complaints, headache, numbness,  weakness or ataxia or problems with walking or coordination,  change in mood or  memory.        Current Meds  Medication Sig  . clotrimazole-betamethasone (LOTRISONE) cream Apply 1 application topically 2 (two) times daily. Apply twice daily as needed  . diphenhydrAMINE HCl, Sleep, (SLEEP-AID PO) Take 1 tablet by mouth at bedtime as needed.  Marland Kitchen ibuprofen (ADVIL,MOTRIN) 200 MG tablet Take 200 mg by mouth as needed.    Marland Kitchen levothyroxine (SYNTHROID) 150 MCG tablet Take 1 tablet (150 mcg total) by mouth daily  before breakfast.  . polyethylene glycol powder (GLYCOLAX/MIRALAX) powder Take 17 grams  1 to 2 times a day . Titrate as needed.           Past Medical History:  HYPERLIPIDEMIA (ICD-272.4)  - target LDL < 130 based on positive family history and remote smoking hx  CHRONIC RHINITIS (ICD-472.0)  PALPITATIONS, HX OF (ICD-V12.50)  DIVERTICULOSIS........................................................................Marland Kitchen     [facility] (was [name])  - See colonoscopy 04/26/07  neck pain positive MRI with right C6/7 protrusion ......................Marland Kitchen      Dr. Ninfa Linden  Urinary Hestiance/ decrease FOS 2017 .........................................  [place]...................................................................Marland KitchenWert  - CPX 05/29/2018  - DT 11/28/2015  - Pneumovax 05/2005 and 01/21/10    Family History:  Cancer in two sisters one liver, one thyroid  heart disease in mother in her 43s father in his 46s  Stroke father  Allergies  brother  Enlarged prostate- Father no prostate ca Prostate canceer- Maternal uncle   Social History:  self-employed---remodeling married and lives with wife  has children  Patient states former smoker, light, quit 1993  Occ ETOH        Objective:   Physical Exam  [place]ber wm nad     Wt 224 07/27/2011 > 01/19/2013  216 > 04/15/14 224   > 06/12/2014 227 >228 05/27/2015 > 06/28/2015  227>228 07/22/2015 > 11/28/2015  223 > 05/25/2016  228 >   03/24/2017  232 > 05/27/2017  234 >  01/06/2018  231 > 05/29/2018  235 > 05/10/2019  234   Vital signs reviewed - Note on arrival 02 sats  98% on RA   HEENT: nl dentition, turbinates bilaterally, and oropharynx. Nl external ear canals without cough reflex   NECK :  without JVD/Nodes/TM/ nl carotid upstrokes bilaterally   LUNGS: no acc muscle use,  Nl contour chest which is clear to A and P bilaterally without cough on insp or exp maneuvers   CV:  RRR  no s3 or murmur or increase in P2, and no edema   ABD:  soft and nontender with nl inspiratory excursion in the supine position. No bruits or organomegaly appreciated, bowel sounds nl  MS:  Nl gait/ ext warm without deformities, calf tenderness, cyanosis or clubbing No obvious joint restrictions   SKIN: warm and dry without lesions    NEURO:  alert, approp, nl sensorium with  no motor or cerebellar deficits apparent.    Lab Results  Component Value Date   TSH 4.60 (H) 05/10/2019                       Assessment:

## 2019-05-10 NOTE — Patient Instructions (Signed)
Please remember to go to the lab department   for your tests - we will call you with the results when they are available.      Please schedule a follow up visit in 6 months but call sooner if needed - cpx on return

## 2019-05-10 NOTE — Progress Notes (Signed)
ATC, NA and no option to leave msg 

## 2019-05-11 ENCOUNTER — Telehealth: Payer: Self-pay | Admitting: Internal Medicine

## 2019-05-11 NOTE — Telephone Encounter (Signed)
Notes recorded by Rosana Berger, CMA on 05/10/2019 at 5:08 PM EDT ATC, NA and no option to leave msg ------  Notes recorded by Tanda Rockers, MD on 05/10/2019 at 1:27 PM EDT Call patient : Study is uc/w borderline adequate thyroid replacement -if starting to feel any more sluggish in terms of energy level we could increase to the 175 mcg/day dose but is not mandatory.  Spoke with patient. He verbalized understanding. He stated that he is feeling ok right now on the 112mcg dosage and wishes to stay at this dosage. Advised him to call us back if he stays to feel sluggish again. He verbalized understanding.   Nothing further needed at time of call.

## 2019-05-14 ENCOUNTER — Encounter: Payer: Self-pay | Admitting: Internal Medicine

## 2019-05-14 NOTE — Assessment & Plan Note (Signed)
Onset 2016  Synthroid 50 mcg per day started 06/25/15 > increased to 75 mcg per day 09/03/15  - TSH 05/25/2016  = 4.8 on 75 mcg per day > increased to 100 mcg per day > 11/25/2016 =3.02  - stopped synthroid about 02/24/17 > seen 03/24/2017 and euthyroid clinically so ok to leave off unless symptoms  - TSH 12 05/27/2017 > resume synthroid 100 and recheck in 6 weeks > returned 11/28/2017 =  8.37 so rec 125 mcg daily and f/u by 01/27/18 > tsh 05/29/2018 =3.75 on 125 /day > no change rx  - TSH 05/10/2019 = 4.60 on 150 /day > no change unless new symptoms   Adequate control on present rx, reviewed in detail with pt > no change in rx needed    F/u q 6 m

## 2019-05-31 ENCOUNTER — Ambulatory Visit: Payer: Managed Care, Other (non HMO) | Admitting: Internal Medicine

## 2019-07-06 ENCOUNTER — Ambulatory Visit: Payer: Managed Care, Other (non HMO) | Admitting: Internal Medicine

## 2019-07-18 ENCOUNTER — Telehealth: Payer: Self-pay | Admitting: Internal Medicine

## 2019-07-18 NOTE — Telephone Encounter (Signed)
Spoke with the pt  He is thinking of getting some sort of hair restoration therapy done  He is wondering with his thyroid problem and with taking synthroid if this will have affect on hair loss  Please advise, thanks

## 2019-07-18 NOTE — Telephone Encounter (Signed)
Called and spoke with pt letting him know the information stated by MW. Pt verbalized understanding. Nothing further needed.

## 2019-07-18 NOTE — Telephone Encounter (Signed)
Should be fine but that's all the more reason to take meds consistently and keep up with f/u advice /appts

## 2019-07-30 ENCOUNTER — Telehealth: Payer: Self-pay | Admitting: Internal Medicine

## 2019-07-30 NOTE — Telephone Encounter (Signed)
Can't rec anything stronger than advil 200 x 3 with meals and he should contact Ninfa Linden his ortho doc of record

## 2019-07-30 NOTE — Telephone Encounter (Signed)
Called and spoke with pt who stated after lifting something heavy two days ago, he strained his back. Pt said toward the center of back at the bottom is where pt is feeling this and states he believes there is some inflammation in back as well.  Pt has taken tylenol which he said has helped some but not enough to fully take away the pain. Pt is requesting something stronger than tylenol to be called in to help with the pain in his back. Dr. Melvyn Novas, please advise on this for pt. Thanks!

## 2019-07-30 NOTE — Telephone Encounter (Signed)
Called and spoke with pt letting him know the info stated by MW. Pt verbalized understanding and stated he would try the advil to see if that would help and if it did not he said he would contact Dr. Ninfa Linden. Nothing further needed.

## 2019-09-05 ENCOUNTER — Encounter (HOSPITAL_COMMUNITY): Payer: Self-pay | Admitting: Emergency Medicine

## 2019-09-05 ENCOUNTER — Ambulatory Visit (HOSPITAL_COMMUNITY)
Admission: EM | Admit: 2019-09-05 | Discharge: 2019-09-05 | Disposition: A | Discharge status: Home / Self Care | Payer: Self-pay | Attending: Family Medicine | Admitting: Family Medicine

## 2019-09-05 ENCOUNTER — Other Ambulatory Visit: Payer: Self-pay

## 2019-09-05 DIAGNOSIS — S61411A Laceration without foreign body of right hand, initial encounter: Secondary | ICD-10-CM

## 2019-09-05 NOTE — ED Triage Notes (Signed)
Pt here with laceration to right hand from razor blade; bleeding controlled with pressure dressing

## 2019-09-05 NOTE — ED Provider Notes (Signed)
[facility]   [id number] 09/05/19 Arrival Time: [id number]  ASSESSMENT & PLAN:  1. Laceration of right hand without foreign body, initial encounter     Procedure: Verbal consent obtained. Patient provided with risks and alternatives to the procedure. Wound copiously irrigated with NS then cleansed with betadine. Local anesthesia: Lidocaine 1% with epinephrine. Wound carefully explored. No foreign body, tendon injury, or nonviable tissue were noted. Using sterile technique, 7 interrupted 4-0 Prolene sutures were placed to reapproximate the wound. Procedure tolerated well. No complications. Minimal bleeding. Advised to look for and return for any signs of infection such as redness, swelling, discharge, or worsening pain. Return for suture removal in 7-10 days.  Reviewed expectations re: course of current medical issues. Questions answered. Outlined signs and symptoms indicating need for more acute intervention. Patient verbalized understanding. After Visit Summary given.   SUBJECTIVE:  Joel Franco is a 63 y.o. male who presents with a laceration of his R hand; thenar. Cut with new straight razor 1-2 hours ago. Moderate bleeding; controlled. Minimal discomfort. No extremity sensation changes or weakness.  Td UTD: Yes. Approx 4 y ago.  ROS: As per HPI.  OBJECTIVE:  Vitals:   09/05/19 1509  BP: 138/86  Pulse: 78  Resp: 16  Temp: 98.1 F (36.7 C)  TempSrc: Temporal  SpO2: 100%     General appearance: alert; no distress Skin: linear laceration of R hand, thenar; size: approx 2 cm; clean wound edges, no foreign bodies; with mild active bleeding; all fingers of R hand with FROM, normal capillary refill, and normal distal sensation Psychological: alert and cooperative; normal mood and affect   Labs Reviewed - No data to display  No results found.  Allergies  Allergen Reactions  . Amoxicillin     Past Medical History:  Diagnosis Date  . Chronic rhinitis   .  Diverticulosis   . Other and unspecified hyperlipidemia   . Personal history of unspecified circulatory disease    Social History   Socioeconomic History  . Marital status: Married    Spouse name: Not on file  . Number of children: 2  . Years of education: Not on file  . Highest education level: Not on file  Occupational History  . Occupation: Art gallery manager: [employer]  . Financial resource strain: Not on file  . Food insecurity    Worry: Not on file    Inability: Not on file  . Transportation needs    Medical: Not on file    Non-medical: Not on file  Tobacco Use  . Smoking status: Former Smoker    Types: Cigarettes    Quit date: 12/28/1991    Years since quitting: 27.7  . Smokeless tobacco: Never Used  . Tobacco comment: "smoked on and off only occ"  Substance and Sexual Activity  . Alcohol use: No  . Drug use: No  . Sexual activity: Not on file  Lifestyle  . Physical activity    Days per week: Not on file    Minutes per session: Not on file  . Stress: Not on file  Relationships  . Social Herbalist on phone: Not on file    Gets together: Not on file    Attends religious service: Not on file    Active member of club or organization: Not on file    Attends meetings of clubs or organizations: Not on file    Relationship status: Not  on file  Other Topics Concern  . Not on file  Social History Narrative  . Not on file         Vanessa Kick, MD 09/05/19 [id number]

## 2019-09-17 ENCOUNTER — Other Ambulatory Visit: Payer: Self-pay

## 2019-09-17 ENCOUNTER — Ambulatory Visit (HOSPITAL_COMMUNITY)
Admission: EM | Admit: 2019-09-17 | Discharge: 2019-09-17 | Disposition: A | Discharge status: Home / Self Care | Payer: 59 | Attending: Family Medicine | Admitting: Family Medicine

## 2019-09-17 DIAGNOSIS — Z4802 Encounter for removal of sutures: Secondary | ICD-10-CM | POA: Diagnosis not present

## 2019-09-17 NOTE — ED Triage Notes (Signed)
Pt here for suture removal to right hand; 5 sutures removed and wound well healed; looked at by [facility]

## 2019-12-03 ENCOUNTER — Other Ambulatory Visit: Payer: Self-pay | Admitting: Internal Medicine

## 2019-12-19 ENCOUNTER — Encounter: Payer: Self-pay | Admitting: Internal Medicine

## 2019-12-19 ENCOUNTER — Other Ambulatory Visit: Payer: Self-pay

## 2019-12-19 ENCOUNTER — Ambulatory Visit (INDEPENDENT_AMBULATORY_CARE_PROVIDER_SITE_OTHER): Payer: 59 | Admitting: Internal Medicine

## 2019-12-19 DIAGNOSIS — E039 Hypothyroidism, unspecified: Secondary | ICD-10-CM

## 2019-12-19 DIAGNOSIS — G4700 Insomnia, unspecified: Secondary | ICD-10-CM

## 2019-12-19 NOTE — Assessment & Plan Note (Signed)
Onset 2016  Synthroid 50 mcg per day started 06/25/15 > increased to 75 mcg per day 09/03/15  - TSH 05/25/2016  = 4.8 on 75 mcg per day > increased to 100 mcg per day > 11/25/2016 =3.02  - stopped synthroid about 02/24/17 > seen 03/24/2017 and euthyroid clinically so ok to leave off unless symptoms  - TSH 12 05/27/2017 > resume synthroid 100 and recheck in 6 weeks > returned 11/28/2017 =  8.37 so rec 125 mcg daily and f/u by 01/27/18 > tsh 05/29/2018 =3.75 on 125 /day > no change rx  - TSH 05/10/2019 = 4.60 on 150 /day > no change unless new symptoms  Clinically euthyroid, Adequate control on present rx, reviewed in detail with pt > no change in rx needed    Pt informed of the seriousness of COVID 19 infection as a direct risk to lung health  and safey and to close contacts and should continue to wear a facemask in public and minimize exposure to public locations but especially avoid any area or activity where non-close contacts are not observing distancing or wearing an appropriate face mask.  I strongly recommended vaccine when offered.   >>> recheck in 6 m with cpx delayed due to COVID 19 restrictions

## 2019-12-19 NOTE — Progress Notes (Signed)
Subjective:     Patient ID: Joel Franco, male   DOB: 1956/04/30     MRN: [id number]    Brief patient profile:  58 yowm remote minimal smoker with chronic palpitations that typically occur more at rest than they do with activity and not progressive, if anything less noticeable as he has aged .   History of Present Illness  June 12, 2009 cpx no urinary problems, overall doing fine except for problems staying asleep. rec sleep hygiene reviewed    11/28/2015  f/u ov/Naamah Boggess re: hypothyroid Chief Complaint  Patient presents with  . Follow-up    Pt c/o restless sleep, insomnia and chronic fatigue. Pt's wife would like to have him checked for sleep apnea. Pt states that his symptoms have been increasingly worse since the tick bite earlier this year.   arthritis better,  Twice a week advil most of the time  Falls asleep fine but wakes up in 2-3 hours then reads x up to 30 min and after sev hours but does not feel refreshed in am  Better p benadryl but feels hungover  rec Tetanus shot today  Try trazodone 25 mg at bedtime    11/28/15 >  05/25/2016  Lots of phone calls/ lmtcb re trazadone side effects/ thyroid dose.    05/10/2019  f/u ov/Zahir Eisenhour re: hypothyroid - wife lost job and insurance due to [employer]  Chief Complaint  Patient presents with  . Annual Exam    Pt is fasting. He states he has lost his insurance since the last appt.   Dyspnea:  Not limited by breathing from desired activities   Cough: minimal assoc with pnds - not taking any meds  Sleeping: flat in bed rec No change rx = synthroid 150 mcg daily     Virtual Visit via Telephone Note 12/19/2019  Insomnia   I connected with Joel Franco on 12/19/19 at  1:20  PM EST by telephone and verified that I am speaking with the correct person using two identifiers.   I discussed the limitations, risks, security and privacy concerns of performing an evaluation and management service by telephone and the availability of in person  appointments. I also discussed with the patient that there may be a patient responsible charge related to this service. The patient expressed understanding and agreed to proceed.   History of Present Illness: Dyspnea:  Not limited by breathing from desired activities   Cough: none Sleeping: sleep on side/ one pillow wakes up 3 am p bed at 11 pm and urinates then can't get back sleep SABA use: none 02: none  Energy level is good when sleeps well, no heat/cold intol/ wt loss/ palpitations or hypersomnolence or palpitations or depression  No  excess/ purulent sputum or mucus plugs or hemoptysis or cp or chest tightness, subjective wheeze or overt sinus or hb symptoms.    Also denies any obvious fluctuation of symptoms with weather or environmental changes or other aggravating or alleviating factors except as outlined above.   Meds reviewed/ med reconciliation completed       Observations/Objective: Nl phonation, no increased wob   Assessment and Plan: See problem list for active a/p's   Follow Up Instructions: See avs for instructions unique to this ov which includes revised/ updated med list     I discussed the assessment and treatment plan with the patient. The patient was provided an opportunity to ask questions and all were answered. The patient agreed with the plan and demonstrated  an understanding of the instructions.   The patient was advised to call back or seek an in-person evaluation if the symptoms worsen or if the condition fails to improve as anticipated.  I provided 25  minutes of non-face-to-face time during this encounter.   Christinia Gully, MD           Past Medical History:  HYPERLIPIDEMIA (ICD-272.4)  - target LDL < 130 based on positive family history and remote smoking hx  CHRONIC RHINITIS (ICD-472.0)  PALPITATIONS, HX OF (ICD-V12.50)  DIVERTICULOSIS........................................................................Marland Kitchen     [facility] (was  [name])  - See colonoscopy 04/26/07  neck pain positive MRI with right C6/7 protrusion ......................Marland Kitchen      Dr. Ninfa Linden  Urinary Hestiance/ decrease FOS 2017 .........................................  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

## 2019-12-19 NOTE — Patient Instructions (Addendum)
Completely eliminate all caffeine from your diet  Regular exercise at level where you are short of breath but not out of breath x 30 minutes min   Get your brain ready for sleep for by reading by indirect light  And it's fine to use melatonin  Please schedule a follow up office visit in 6 months call sooner if needed for CPX

## 2019-12-19 NOTE — Assessment & Plan Note (Signed)
Onset 2010 delayed insomnia  trial of trazadone 11/28/2015 > did not tolera  Reviewed optimal sleep hygiene in detail/ ok to use melatonin but avoid sedative/hypnotics.  F/u cpx in 6 m, call sooner if needed    Each maintenance medication was reviewed in detail including most importantly the difference between maintenance and as needed and under what circumstances the prns are to be used.  Please see AVS for specific  Instructions which are unique to this visit and I personally typed out  which were reviewed in detail over the phone with the patient and a copy provided via MyChart

## 2019-12-31 DIAGNOSIS — M5416 Radiculopathy, lumbar region: Secondary | ICD-10-CM | POA: Diagnosis not present

## 2019-12-31 DIAGNOSIS — M5386 Other specified dorsopathies, lumbar region: Secondary | ICD-10-CM | POA: Diagnosis not present

## 2019-12-31 DIAGNOSIS — M5033 Other cervical disc degeneration, cervicothoracic region: Secondary | ICD-10-CM | POA: Diagnosis not present

## 2019-12-31 DIAGNOSIS — M9901 Segmental and somatic dysfunction of cervical region: Secondary | ICD-10-CM | POA: Diagnosis not present

## 2020-02-07 DIAGNOSIS — M5386 Other specified dorsopathies, lumbar region: Secondary | ICD-10-CM | POA: Diagnosis not present

## 2020-02-07 DIAGNOSIS — M9901 Segmental and somatic dysfunction of cervical region: Secondary | ICD-10-CM | POA: Diagnosis not present

## 2020-02-07 DIAGNOSIS — M5033 Other cervical disc degeneration, cervicothoracic region: Secondary | ICD-10-CM | POA: Diagnosis not present

## 2020-02-07 DIAGNOSIS — M5416 Radiculopathy, lumbar region: Secondary | ICD-10-CM | POA: Diagnosis not present

## 2020-02-27 DIAGNOSIS — M9901 Segmental and somatic dysfunction of cervical region: Secondary | ICD-10-CM | POA: Diagnosis not present

## 2020-02-27 DIAGNOSIS — M5033 Other cervical disc degeneration, cervicothoracic region: Secondary | ICD-10-CM | POA: Diagnosis not present

## 2020-02-27 DIAGNOSIS — M5386 Other specified dorsopathies, lumbar region: Secondary | ICD-10-CM | POA: Diagnosis not present

## 2020-02-27 DIAGNOSIS — M5416 Radiculopathy, lumbar region: Secondary | ICD-10-CM | POA: Diagnosis not present

## 2020-02-29 ENCOUNTER — Other Ambulatory Visit: Payer: 59

## 2020-02-29 ENCOUNTER — Other Ambulatory Visit: Payer: Self-pay

## 2020-02-29 ENCOUNTER — Encounter: Payer: Self-pay | Admitting: Internal Medicine

## 2020-02-29 ENCOUNTER — Ambulatory Visit (INDEPENDENT_AMBULATORY_CARE_PROVIDER_SITE_OTHER): Payer: BC Managed Care – PPO | Admitting: Internal Medicine

## 2020-02-29 DIAGNOSIS — E039 Hypothyroidism, unspecified: Secondary | ICD-10-CM

## 2020-02-29 DIAGNOSIS — G4700 Insomnia, unspecified: Secondary | ICD-10-CM

## 2020-02-29 DIAGNOSIS — I10 Essential (primary) hypertension: Secondary | ICD-10-CM

## 2020-02-29 MED ORDER — PROPRANOLOL HCL 40 MG PO TABS: 40.0000 mg | ORAL_TABLET | Freq: Two times a day (BID) | ORAL | 11 refills | Status: DC

## 2020-02-29 NOTE — Progress Notes (Signed)
Subjective:     Patient ID: Joel Franco, male   DOB: 05/24/1956     MRN: [id number]    Brief patient profile:  76 yowm remote minimal smoker with chronic palpitations that typically occur more at rest than they do with activity and not progressive, if anything less noticeable as he has aged .   History of Present Illness  June 12, 2009 cpx no urinary problems, overall doing fine except for problems staying asleep. rec sleep hygiene reviewed    07/22/2015 NP Follow up : Hypothyroid  Pt returns for evaluation .  Seen last ov with fatigue  Labs showed hypothyroidism with TSH ~8.47  He was started on Synthroid 35mcg.  He is starting to feel better. Less fatigue and joint pains.  Had tick bite with rash in May , given 3 weeks of Doxycycline for  Suspected Lymes dz . He did not go for labs as recommended in May  For Lyme titer. He denies muscle weakness, rash or neck stiffness  No speech issues.  No chest pain, orthopnea, edema or fever.  MW pt here for 4 week follow up. Pt is taking synthroid. Pt c/o fatigue and dry mouth.  rec Increase tsh to 75 mcg daily    11/28/2015  f/u ov/Joel Franco re: hypothyroid Chief Complaint  Patient presents with  . Follow-up    Pt c/o restless sleep, insomnia and chronic fatigue. Pt's wife would like to have him checked for sleep apnea. Pt states that his symptoms have been increasingly worse since the tick bite earlier this year.   arthritis better,  Twice a week advil most of the time  Falls asleep fine but wakes up in 2-3 hours then reads x up to 30 min and after sev hours but does not feel refreshed in am  Better p benadryl but feels hungover  rec Tetanus shot today  Try trazodone 25 mg at bedtime    11/28/15 >  05/25/2016  Lots of phone calls/ lmtcb re trazadone side effects/ thyroid dose.   05/25/2016  f/u ov/Joel Franco re: cpx  Chief Complaint  Patient presents with  . Annual Exam    Pt is fasting today. He states injured his left knee approx 6  wks ago-seeing [facility]. No other new co's today.   limited by R knee  rec Increased synthroid to 100   11/25/2016  f/u ov/Joel Franco re:  Hypothyroid / urinary hesitance/ no maint meds from urology yet  Chief Complaint  Patient presents with  . Follow-up    denies new co's  R knee surgery set for Dec 13th 2017 Dr Veverly Fells / min need for advil/ taking fish oil for joints  Variable decrease fos despite rx with cipro by urology rec f/u urology yearly due 06/2017    03/24/2017  f/u ov/Joel Franco re:  Chief Complaint  Patient presents with  . Follow-up    Stopped synthroid 3-4 wks ago b/c he ran out and states he felt fine. He does c/o constipation, started "forever and a day ago".    mild fatigue, wt gain, constipation but these were chronic issues before stopped the synthroid  Not limited by breathing from desired activities   Variable decrease fos/ f/u by [place] but no notes in epic     05/27/2017  f/u ov/Joel Franco re: hypothyroid / urinary hesitance  Chief Complaint  Patient presents with  . Annual Exam    Pt is fasting.  Still has constipation that comes and goes. He states that he  feels very tired all of the time.    due for f/u urology 06/2017 Overdue for colonoscopy 03/2007 last study  Not limited by breathing from desired activities  Main complaint is fatigue ? Worse off synthroid (he denies)   rec If Dr Karsten Ro doesn't call you before 07/27/17 for follow up then you should call him  Please see patient coordinator before you leave today  to schedule GI eval/ colonoscopy due Advil can be taken up to 3-4 with meals as needed for joint pain Please remember to go to the lab department downstairs in the basement  for your tests - we will call you with the results when they are available. Please schedule a follow up visit in 6 months but call sooner if needed Add:  tsh 12 so started back on 100 mcg / day and f/u in 6 weeks with goal tsh < 2         05/29/2018  f/u ov/Joel Franco re:  cpx  Hypothryoid/urinary hesitance/ HBP Chief Complaint  Patient presents with  . Annual Exam    Pt is fasting. He is doing well and no new co's.   Dyspnea:  Not limited Cough: no  Sleep: fine  SABA use:  None rec No change rx    05/10/2019  f/u ov/Joel Franco re: hypothyroid - wife lost job and insurance due to [place] Complaint  Patient presents with  . Annual Exam    Pt is fasting. He states he has lost his insurance since the last appt.   Dyspnea:  Not limited by breathing from desired activities   Cough: minimal assoc with pnds - not taking any meds  Sleeping: flat in bed SABA use: no rec No change rx    02/29/2020  f/u ov/Joel Franco re: hypothyroid / urinary hesitance/HBP  new Chief Complaint  Patient presents with  . Follow-up    Hypothyroidism, acquired  Dyspnea:  Not limited by breathing from desired activities   Cough: none  Sleeping:  Goes to bed 11 pm falls asleep then up 3 am struggles to get back to sleep but not sleepy during the day/ no better on trazadone, admits stress related to wife's dx of ca SABA use: noe  02: none    No obvious day to day or daytime variability in any of his symptoms or assoc excess/ purulent sputum or mucus plugs or hemoptysis or cp or chest tightness, subjective wheeze or overt sinus or hb symptoms.     Also denies any obvious fluctuation of symptoms with weather or environmental changes or other aggravating or alleviating factors except as outlined above   No unusual exposure hx or h/o childhood pna/ asthma or knowledge of premature birth.  Current Allergies, Complete Past Medical History, Past Surgical History, Family History, and Social History were reviewed in [employer] record.  ROS  The following are not active complaints unless bolded Hoarseness, sore throat, dysphagia, dental problems, itching, sneezing,  nasal congestion or discharge of excess mucus or purulent secretions, ear ache,   fever, chills, sweats,  unintended wt loss or wt gain, classically pleuritic or exertional cp,  orthopnea pnd or arm/hand swelling  or leg swelling, presyncope, palpitations, abdominal pain, anorexia, nausea, vomiting, diarrhea  or change in bowel habits or change in bladder habits, change in stools or change in urine, dysuria, hematuria,  rash, arthralgias, visual complaints, headache, numbness, weakness or ataxia or problems with walking or coordination,  change in mood or  memory.  Current Meds  Medication Sig  . clotrimazole-betamethasone (LOTRISONE) cream Apply 1 application topically 2 (two) times daily. Apply twice daily as needed  . diphenhydrAMINE HCl, Sleep, (SLEEP-AID PO) Take 1 tablet by mouth at bedtime as needed.  Marland Kitchen ibuprofen (ADVIL,MOTRIN) 200 MG tablet Take 200 mg by mouth as needed.    Marland Kitchen levothyroxine (SYNTHROID) 150 MCG tablet TAKE 1 TABLET BY MOUTH BEFORE BREAKFAST  . polyethylene glycol powder (GLYCOLAX/MIRALAX) powder Take 17 grams  1 to 2 times a day . Titrate as needed.                   Past Medical History:  HYPERLIPIDEMIA (ICD-272.4)  - target LDL < 130 based on positive family history and remote smoking hx  CHRONIC RHINITIS (ICD-472.0)  PALPITATIONS, HX OF (ICD-V12.50)  DIVERTICULOSIS........................................................................Marland Kitchen     [facility] (was [name])  - See colonoscopy 04/26/07  neck pain positive MRI with right C6/7 protrusion ......................Marland Kitchen      Dr. Ninfa Linden  Urinary Hestiance/ decrease FOS 2017 .........................................  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    NECK :  without JVD/Nodes/TM/ nl carotid upstrokes bilaterally   LUNGS: no acc muscle use,  Nl contour chest which is clear to A and P bilaterally without cough on insp or exp maneuvers   CV:  RRR  no s3 or murmur or increase in P2, and no edema   ABD:  soft and nontender with nl inspiratory excursion in the supine position. No bruits or organomegaly appreciated, bowel sounds nl  MS:  Nl gait/ ext warm without deformities, calf tenderness, cyanosis or clubbing No obvious joint restrictions   SKIN: warm and dry without lesions    NEURO:  alert, approp, nl sensorium with  no motor or cerebellar deficits apparent.    Lab Results  Component Value Date   TSH 2.280 02/29/2020       Labs ordered/ reviewed:      Chemistry      Component Value Date/Time   NA 141 02/29/2020 1551   K 4.1 02/29/2020 1551   CL 105 02/29/2020 1551   CO2 23 02/29/2020 1551   BUN 18 02/29/2020 1551   CREATININE 0.93 02/29/2020 1551      Component Value Date/Time   CALCIUM 9.4 02/29/2020 1551  Assessment:

## 2020-02-29 NOTE — Patient Instructions (Addendum)
Inderal 40 mg twice daily   We will set you up with a sleep medicine doctor next available   Please remember to go to the lab department   for your tests - we will call you with the results when they are available.     Please schedule a follow up visit in 3 months but call sooner if needed with CPX

## 2020-03-01 ENCOUNTER — Encounter: Payer: Self-pay | Admitting: Internal Medicine

## 2020-03-01 LAB — BASIC METABOLIC PANEL
BUN/Creatinine Ratio: 19 (ref 10–24)
BUN: 18 mg/dL (ref 8–27)
CO2: 23 mmol/L (ref 20–29)
Calcium: 9.4 mg/dL (ref 8.6–10.2)
Chloride: 105 mmol/L (ref 96–106)
Creatinine, Ser: 0.93 mg/dL (ref 0.76–1.27)
GFR calc Af Amer: 101 mL/min/{1.73_m2} (ref >59)
GFR calc non Af Amer: 87 mL/min/{1.73_m2} (ref >59)
Glucose: 95 mg/dL (ref 65–99)
Potassium: 4.1 mmol/L (ref 3.5–5.2)
Sodium: 141 mmol/L (ref 134–144)

## 2020-03-01 LAB — TSH: TSH: 2.28 u[IU]/mL (ref 0.450–4.500)

## 2020-03-01 NOTE — Assessment & Plan Note (Addendum)
Onset 2010 delayed insomnia  trial of trazadone 11/28/2015 > did not tolerate  Again advised on reading if wakes up and can't get back to sleep, agreed to  referral to sleep medicine for additional thoughts on better sleep hygiene or medication options.

## 2020-03-01 NOTE — Assessment & Plan Note (Signed)
Lab Results  Component Value Date   CREATININE 0.93 02/29/2020   CREATININE 0.79 05/29/2018   CREATININE 0.79 05/27/2017     Assoc with anxiety so rec trial of inderal 40 mg bid and f/u 3 m for cpx         Each maintenance medication was reviewed in detail including emphasizing most importantly the difference between maintenance and prns and under what circumstances the prns are to be triggered using an action plan format where appropriate.  Total time for H and P, chart review, counseling,   and generating customized AVS unique to this office visit / charting = 30 min

## 2020-03-01 NOTE — Assessment & Plan Note (Signed)
Onset 2016  Synthroid 50 mcg per day started 06/25/15 > increased to 75 mcg per day 09/03/15  - TSH 05/25/2016  = 4.8 on 75 mcg per day > increased to 100 mcg per day > 11/25/2016 =3.02  - stopped synthroid about 02/24/17 > seen 03/24/2017 and euthyroid clinically so ok to leave off unless symptoms  - TSH 12 05/27/2017 > resume synthroid 100 and recheck in 6 weeks > returned 11/28/2017 =  8.37 so rec 125 mcg daily and f/u by 01/27/18 > tsh 05/29/2018 =3.75 on 125 /day > no change rx  - TSH 02/29/2020   = 2.28 on 150 mcg/ day   Clinically and chemically euthyroid, no changes needed

## 2020-03-03 ENCOUNTER — Telehealth: Payer: Self-pay | Admitting: Internal Medicine

## 2020-03-03 NOTE — Telephone Encounter (Addendum)
I spoke with the pt and made aware the goal should be around 130/80  He states has been eating a lot of pork (country ham) and I advised that he needs to cut this out and watch his sodium intake  Pt verbalized understanding  Will call if readings are consistently elevated above 140/80 Will forward to Dr Melvyn Novas to see if he has any other recs

## 2020-03-03 NOTE — Progress Notes (Signed)
Spoke with pt and notified of results per Dr. [name]. Pt verbalized understanding.

## 2020-03-03 NOTE — Telephone Encounter (Signed)
See below

## 2020-03-03 NOTE — Telephone Encounter (Signed)
Agree with 130 /80 or less

## 2020-03-05 ENCOUNTER — Telehealth: Payer: Self-pay | Admitting: Internal Medicine

## 2020-03-05 DIAGNOSIS — M9901 Segmental and somatic dysfunction of cervical region: Secondary | ICD-10-CM | POA: Diagnosis not present

## 2020-03-05 DIAGNOSIS — M5386 Other specified dorsopathies, lumbar region: Secondary | ICD-10-CM | POA: Diagnosis not present

## 2020-03-05 DIAGNOSIS — M5033 Other cervical disc degeneration, cervicothoracic region: Secondary | ICD-10-CM | POA: Diagnosis not present

## 2020-03-05 DIAGNOSIS — M5416 Radiculopathy, lumbar region: Secondary | ICD-10-CM | POA: Diagnosis not present

## 2020-03-05 NOTE — Telephone Encounter (Signed)
Called and spoke with Patient.  Patient stated he was started 02/29/20 Inderal  40mg ,  two times a day.  Patient stated he is feeling very tired since he started taking BP med.  Patient stated he use to run after work, and now he is very tired.  Patient denies any lightheadedness, or dizziness.  Patient stated he is taking 1/2 tab (20mg ) twice a day. Patient stated he has been checking BP, and today's reading was 133/78.  Patient stated he has cut out pork,and sodium from diet, hoping that would help BP.  Message routed to Dr. Melvyn Novas  Instructions from 02/29/20 OV-  Inderal 40 mg twice daily   We will set you up with a sleep medicine doctor next available   Please remember to go to the lab department   for your tests - we will call you with the results when they are available.     Please schedule a follow up visit in 3 months but call sooner if needed with CPX

## 2020-03-06 NOTE — Telephone Encounter (Signed)
Returned call to patient and notified per Dr. Melvyn Novas advise. He agrees with plan to cut back to 1/2 tab for next several days and monitor bp. He was informed to call back and schedule televisit if this does not work. Also patient inquired about appt with sleep doctor. Informed that he was supposed to set appt up at [place]. He was advised to call back when he is ready to schedule. Nothing further needed.

## 2020-03-06 NOTE — Telephone Encounter (Signed)
Sorry to hear the medicine is too strong but that last bp looks fine but I don't know when it was taken relative to his last dose of 20 mg  - should try just taking a half pill in evening for a few days and see what happens to his daytime bp and if ok just leave it there and let his body get used to the new med.   If not tolerating even that then let us know or make televisit next week to talk it over.

## 2020-03-07 ENCOUNTER — Telehealth: Payer: Self-pay | Admitting: Internal Medicine

## 2020-03-07 NOTE — Telephone Encounter (Signed)
Pt was seen on 3/5 by MW (pcp pt), and was told to follow up with sleep specialist next available in our clinic per his AVS.  This was not scheduled.  Scheduled pt to see AO on 3/24.  Nothing further needed at this time- will close encounter.

## 2020-03-19 ENCOUNTER — Encounter: Payer: Self-pay | Admitting: Pulmonary Disease

## 2020-03-19 ENCOUNTER — Ambulatory Visit (INDEPENDENT_AMBULATORY_CARE_PROVIDER_SITE_OTHER): Payer: BC Managed Care – PPO | Admitting: Pulmonary Disease

## 2020-03-19 ENCOUNTER — Other Ambulatory Visit: Payer: Self-pay

## 2020-03-19 VITALS — BP 112/72 | HR 62 | Temp 97.7°F | Ht 75.0 in | Wt 229.4 lb

## 2020-03-19 DIAGNOSIS — G4700 Insomnia, unspecified: Secondary | ICD-10-CM | POA: Diagnosis not present

## 2020-03-19 DIAGNOSIS — G4719 Other hypersomnia: Secondary | ICD-10-CM | POA: Diagnosis not present

## 2020-03-19 MED ORDER — ESZOPICLONE 1 MG PO TABS: 2.0000 mg | ORAL_TABLET | Freq: Every evening | ORAL | 1 refills | Status: DC | PRN

## 2020-03-19 NOTE — Progress Notes (Signed)
Subjective:    Patient ID: Joel Franco, male    DOB: 06-Oct-1956, 64 y.o.   MRN: [id number]  Patient being seen for nonrestorative sleep Sleep maintenance insomnia  Usually goes to bed between 1030 and 11 Sleeps about 2 to 3 hours and wakes up May take him about an hour to fall back asleep Takes him about 15 to 20 minutes to fall asleep initially He has 2-3 awakenings Final wake up time between 6 and 630  Weight has been stable over the past few years  Lost his parents about 5 years ago, that is when symptoms seem to have started  He admits to snoring Denies witnessed apneas Denies dryness of his mouth in the mornings Denies morning headaches Denies night sweats Memory is good  No concern for obstructive sleep apnea  He did mention that his spouse may have been concerned about him having sleep apnea   History of hypothyroidism  Past Medical History:  Diagnosis Date  . Chronic rhinitis   . Diverticulosis   . Other and unspecified hyperlipidemia   . Personal history of unspecified circulatory disease     Social History   Socioeconomic History  . Marital status: Married    Spouse name: Not on file  . Number of children: 2  . Years of education: Not on file  . Highest education level: Not on file  Occupational History  . 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 Smoking status: Former Smoker    Types: Cigarettes    Quit date: 12/28/1991    Years since quitting: 28.2  . Smokeless tobacco: Never Used  . Tobacco comment: "smoked on and off only occ"  Substance and Sexual Activity  . Alcohol use: No  . Drug use: No  . Sexual activity: Not on file  Other Topics Concern  . Not on file  Social History Narrative  . Not on file   Social Determinants of Health   Financial Resource Strain:   . Difficulty of Paying Living Expenses:   Food Insecurity:   . Worried About Charity fundraiser in the Last Year:   . Academic librarian in the Last Year:   Transportation Needs:   . Film/video editor (Medical):   Marland Kitchen Lack of Transportation (Non-Medical):   Physical Activity:   . Days of Exercise per Week:   . Minutes of Exercise per Session:   Stress:   . Feeling of Stress :   Social Connections:   . Frequency of Communication with Friends and Family:   . Frequency of Social Gatherings with Friends and Family:   . Attends Religious Services:   . Active Member of Clubs or Organizations:   . Attends Archivist Meetings:   Marland Kitchen Marital Status:   Intimate Partner Violence:   . Fear of Current or Ex-Partner:   . Emotionally Abused:   Marland Kitchen Physically Abused:   . Sexually Abused:    Family History  Problem Relation Age of Onset  . Thyroid cancer Sister   . Liver cancer Sister   . Stomach cancer Sister   . Heart disease Mother   . Pancreatic cancer Mother   . Cirrhosis Mother   . Heart disease Father   . Stroke Father   . Prostate cancer Maternal Uncle   . Colon cancer Neg Hx   . Rectal cancer Neg Hx   . Esophageal cancer Neg Hx  Review of Systems  Constitutional: Negative for fever and unexpected weight change.  HENT: Negative for congestion, dental problem, ear pain, nosebleeds, postnasal drip, rhinorrhea, sinus pressure, sneezing, sore throat and trouble swallowing.   Eyes: Negative for redness and itching.  Respiratory: Negative for cough, chest tightness, shortness of breath and wheezing.   Cardiovascular: Negative for palpitations and leg swelling.  Gastrointestinal: Negative for nausea and vomiting.  Genitourinary: Negative for dysuria.  Musculoskeletal: Negative for joint swelling.  Skin: Negative for rash.  Allergic/Immunologic: Negative.  Negative for environmental allergies, food allergies and immunocompromised state.  Neurological: Negative for headaches.  Hematological: Does not bruise/bleed easily.  Psychiatric/Behavioral: Negative for dysphoric mood. The patient is not  nervous/anxious.       Objective:   Physical Exam Constitutional:      Appearance: Normal appearance.  HENT:     Head: Normocephalic and atraumatic.     Nose: Nose normal.  Cardiovascular:     Rate and Rhythm: Normal rate and regular rhythm.     Pulses: Normal pulses.     Heart sounds: Normal heart sounds. No murmur. No friction rub.  Pulmonary:     Effort: Pulmonary effort is normal. No respiratory distress.     Breath sounds: Normal breath sounds. No stridor. No wheezing or rhonchi.  Musculoskeletal:     Cervical back: Normal range of motion and neck supple. No rigidity or tenderness.  Skin:    General: Skin is warm.  Neurological:     General: No focal deficit present.     Mental Status: He is alert.  Psychiatric:        Mood and Affect: Mood normal.    Vitals:   03/19/20 1537  BP: 112/72  Pulse: 62  Temp: 97.7 F (36.5 C)  SpO2: 97%   Results of the Epworth flowsheet 03/19/2020  Sitting and reading 3  Watching TV 2  Sitting, inactive in a public place (e.g. a theatre or a meeting) 1  As a passenger in a car for an hour without a break 1  Lying down to rest in the afternoon when circumstances permit 2  Sitting and talking to someone 0  Sitting quietly after a lunch without alcohol 0  In a car, while stopped for a few minutes in traffic 0  Total score 9      Assessment & Plan:  .  Sleep maintenance insomnia .  Daytime sleepiness .  History of snoring -Denies other symptoms suggesting obstructive sleep apnea  -Did discuss the possibility of obstructive sleep apnea -Symptoms appear to be more related to waking up and not being able to go back to sleep and not getting enough hours of sleep contributing to daytime symptoms  .  Information material regarding obstructive sleep apnea provided to patient .  He was encouraged to give [place] a call about evaluation for sleep apnea if he has symptoms suggesting sleep apnea, I also encouraged him to ask his spouse about  nighttime symptoms  .  Trial with Lunesta  .  I will see him in about 6 weeks  .  Encouraged to call with any significant concerns

## 2020-03-19 NOTE — Patient Instructions (Signed)
Sleep maintenance insomnia  -Trial with Lunesta 2 mg -Start with 1 mg, may increase to 2 as needed -May skip days as tolerated  Snoring -We can consider home sleep study if there is a concern for this   Sleep Apnea Sleep apnea is a condition in which breathing pauses or becomes shallow during sleep. Episodes of sleep apnea usually last 10 seconds or longer, and they may occur as many as 20 times an hour. Sleep apnea disrupts your sleep and keeps your body from getting the rest that it needs. This condition can increase your risk of certain health problems, including:  Heart attack.  Stroke.  Obesity.  Diabetes.  Heart failure.  Irregular heartbeat. What are the causes? There are three kinds of sleep apnea:  Obstructive sleep apnea. This kind is caused by a blocked or collapsed airway.  Central sleep apnea. This kind happens when the part of the brain that controls breathing does not send the correct signals to the muscles that control breathing.  Mixed sleep apnea. This is a combination of obstructive and central sleep apnea. The most common cause of this condition is a collapsed or blocked airway. An airway can collapse or become blocked if:  Your throat muscles are abnormally relaxed.  Your tongue and tonsils are larger than normal.  You are overweight.  Your airway is smaller than normal. What increases the risk? You are more likely to develop this condition if you:  Are overweight.  Smoke.  Have a smaller than normal airway.  Are elderly.  Are male.  Drink alcohol.  Take sedatives or tranquilizers.  Have a family history of sleep apnea. What are the signs or symptoms? Symptoms of this condition include:  Trouble staying asleep.  Daytime sleepiness and tiredness.  Irritability.  Loud snoring.  Morning headaches.  Trouble concentrating.  Forgetfulness.  Decreased interest in sex.  Unexplained sleepiness.  Mood swings.  Personality  changes.  Feelings of depression.  Waking up often during the night to urinate.  Dry mouth.  Sore throat. How is this diagnosed? This condition may be diagnosed with:  A medical history.  A physical exam.  A series of tests that are done while you are sleeping (sleep study). These tests are usually done in a sleep lab, but they may also be done at home. How is this treated? Treatment for this condition aims to restore normal breathing and to ease symptoms during sleep. It may involve managing health issues that can affect breathing, such as high blood pressure or obesity. Treatment may include:  Sleeping on your side.  Using a decongestant if you have nasal congestion.  Avoiding the use of depressants, including alcohol, sedatives, and narcotics.  Losing weight if you are overweight.  Making changes to your diet.  Quitting smoking.  Using a device to open your airway while you sleep, such as: ? An oral appliance. This is a custom-made mouthpiece that shifts your lower jaw forward. ? A continuous positive airway pressure (CPAP) device. This device blows air through a mask when you breathe out (exhale). ? A nasal expiratory positive airway pressure (EPAP) device. This device has valves that you put into each nostril. ? A bi-level positive airway pressure (BPAP) device. This device blows air through a mask when you breathe in (inhale) and breathe out (exhale).  Having surgery if other treatments do not work. During surgery, excess tissue is removed to create a wider airway. It is important to get treatment for sleep apnea. Without  treatment, this condition can lead to:  High blood pressure.  Coronary artery disease.  In men, an inability to achieve or maintain an erection (impotence).  Reduced thinking abilities. Follow these instructions at home: Lifestyle  Make any lifestyle changes that your health care provider recommends.  Eat a healthy, well-balanced  diet.  Take steps to lose weight if you are overweight.  Avoid using depressants, including alcohol, sedatives, and narcotics.  Do not use any products that contain nicotine or tobacco, such as cigarettes, e-cigarettes, and chewing tobacco. If you need help quitting, ask your health care provider. General instructions  Take over-the-counter and prescription medicines only as told by your health care provider.  If you were given a device to open your airway while you sleep, use it only as told by your health care provider.  If you are having surgery, make sure to tell your health care provider you have sleep apnea. You may need to bring your device with you.  Keep all follow-up visits as told by your health care provider. This is important. Contact a health care provider if:  The device that you received to open your airway during sleep is uncomfortable or does not seem to be working.  Your symptoms do not improve.  Your symptoms get worse. Get help right away if:  You develop: ? Chest pain. ? Shortness of breath. ? Discomfort in your back, arms, or stomach.  You have: ? Trouble speaking. ? Weakness on one side of your body. ? Drooping in your face. These symptoms may represent a serious problem that is an emergency. Do not wait to see if the symptoms will go away. Get medical help right away. Call your local emergency services (911 in the [place] Do not drive yourself to the hospital. Summary  Sleep apnea is a condition in which breathing pauses or becomes shallow during sleep.  The most common cause is a collapsed or blocked airway.  The goal of treatment is to restore normal breathing and to ease symptoms during sleep. This information is not intended to replace advice given to you by your health care provider. Make sure you discuss any questions you have with your health care provider. Document Revised: 05/30/2019 Document Reviewed: 08/08/2018 Elsevier Patient Education   [place].

## 2020-04-18 ENCOUNTER — Telehealth: Payer: Self-pay | Admitting: Pulmonary Disease

## 2020-04-18 NOTE — Telephone Encounter (Signed)
Called and spoke with pt who stated that he was prescribed 1mg  lunesta. Pt stated he has gone up to taking 4tabs and states that he has seen some relief but states that he is still not sleeping throughout the night. Pt states that the lunesta is better than any of the other sleep meds he has taken but states that he does wake up feeling drowsy.  Pt wants to know if his dose could be changed as the 4mg  does get him to sleep with no problem but he is still waking up between 4-4:30 each night and states that it usually takes him about 59min-1hr to get back to sleep and once he is in a good sleep, soon it will be time to wake up for the final time and at that point he wakes up not feeling that great.  Dr. Jenetta Downer, please advise.

## 2020-04-22 ENCOUNTER — Other Ambulatory Visit: Payer: Self-pay | Admitting: Pulmonary Disease

## 2020-04-22 MED ORDER — ESZOPICLONE 3 MG PO TABS: 3.0000 mg | ORAL_TABLET | Freq: Every evening | ORAL | 1 refills | Status: DC | PRN

## 2020-04-22 NOTE — Telephone Encounter (Signed)
Called and spoke with pt letting him know the info stated by AO and he verbalized understanding. Pt stated that his wife has recently been diagnosed with breast cancer and so much has been happening recently which could also be contributing to him not able to sleep. Pt has a f/u scheduled 5/5 with Joel Franco and at that appt, he can see if anything might be able to be changed with meds. Nothing further needed.

## 2020-04-22 NOTE — Telephone Encounter (Signed)
Dr. Ander Slade please advise on this message

## 2020-04-22 NOTE — Telephone Encounter (Signed)
Maximum dose of Lunesta is 3 mg  Only 3 mg can be prescribed

## 2020-04-23 ENCOUNTER — Telehealth: Payer: Self-pay | Admitting: Internal Medicine

## 2020-04-23 ENCOUNTER — Ambulatory Visit: Payer: BC Managed Care – PPO | Attending: Internal Medicine

## 2020-04-23 DIAGNOSIS — Z20822 Contact with and (suspected) exposure to covid-19: Secondary | ICD-10-CM | POA: Diagnosis not present

## 2020-04-23 NOTE — Telephone Encounter (Signed)
Spoke with pt. States that his wife tested positive for COVID yesterday. Reports that he is "feeling funny in his head" and really tired. Advised pt that he should get tested as well. I provided him with the number to call to get an appointment for testing. Pt declined making an appointment with [place] at this time, states that he will call us if his symptoms get worse. Nothing further was needed.

## 2020-04-24 ENCOUNTER — Telehealth: Payer: Self-pay | Admitting: Internal Medicine

## 2020-04-24 LAB — NOVEL CORONAVIRUS, NAA: SARS-CoV-2, NAA: DETECTED — AB

## 2020-04-24 LAB — SARS-COV-2, NAA 2 DAY TAT

## 2020-04-24 NOTE — Telephone Encounter (Signed)
FYI:  Result Notes  Marlene Bast  04/24/2020 2:51 PM EDT    Patient notified of + COVID result. Patient test due to symptoms: exposure, body aches, low grade temperature. Patient advised to treat symptoms as needed OTC, contact PCP for follow up and go to ED for trouble breathing ,dehydration or severe weakness. Advised to isolate and safe precautions in the home reviewed. CDC criteria for ending isolation given. Health dept notified with previous note.

## 2020-04-25 ENCOUNTER — Telehealth: Payer: Self-pay | Admitting: Adult Health

## 2020-04-25 NOTE — Telephone Encounter (Signed)
Called to discuss with Joel Franco about Covid symptoms and the use of bamlanivimab, a monoclonal antibody infusion for those with mild to moderate Covid symptoms and at a high risk of hospitalization.     Pt is qualified for this infusion at the [facility] infusion center due to co-morbid conditions and/or a member of an at-risk group, however declines infusion at this time. Symptoms tier reviewed as well as criteria for ending isolation.  Symptoms reviewed that would warrant ED/Hospital evaluation. Preventative practices reviewed. Patient verbalized understanding. Patient advised to call back if he decides that he does want to get infusion. Callback number to the infusion center given. Patient advised to go to Urgent care or ED with severe symptoms. Last date he  would be eligible for infusion is 05/01/20.      Patient Active Problem List   Diagnosis Date Noted  . Essential hypertension 02/29/2020  . Hearing loss of left ear 12/29/2018  . Sore throat 08/07/2018  . Excessive ear wax, bilateral 06/05/2018  . Subungual hematoma of great toe of left foot 01/06/2018  . Tinea cruris 11/28/2017  . Diverticulosis 05/27/2017  . Urinary hesitancy 11/25/2016  . Hypothyroidism, acquired 06/28/2015  . Chronic arthralgias of knees and hips 06/28/2015  . Fatigue 06/23/2015  . Tinnitus 06/12/2014  . Health care maintenance 01/20/2013  . Low back pain 07/27/2011  . ERECTILE DYSFUNCTION 10/23/2008  . INADEQUATE SLEEP HYGIENE (PERSISTENT) 04/26/2008  . Hypercholesteremia 04/25/2008  . PALPITATIONS, HX OF 04/25/2008     Bruin Bolger NP-C  [facility]    04/25/2020

## 2020-04-30 ENCOUNTER — Ambulatory Visit: Payer: BC Managed Care – PPO | Admitting: Pulmonary Disease

## 2020-05-01 ENCOUNTER — Ambulatory Visit (INDEPENDENT_AMBULATORY_CARE_PROVIDER_SITE_OTHER): Payer: BC Managed Care – PPO | Admitting: Adult Health

## 2020-05-01 ENCOUNTER — Encounter: Payer: Self-pay | Admitting: Adult Health

## 2020-05-01 ENCOUNTER — Other Ambulatory Visit: Payer: Self-pay

## 2020-05-01 DIAGNOSIS — U071 COVID-19: Secondary | ICD-10-CM

## 2020-05-01 NOTE — Progress Notes (Signed)
Virtual Visit via Telephone Note  I connected with Delfin R Salay on 05/01/20 at  4:30 PM EDT by telephone and verified that I am speaking with the correct person using two identifiers.  Location: Patient: Home  Provider: Home    I discussed the limitations, risks, security and privacy concerns of performing an evaluation and management service by telephone and the availability of in person appointments. I also discussed with the patient that there may be a patient responsible charge related to this service. The patient expressed understanding and agreed to proceed.   History of Present Illness: 64 yo male minimal smoker followed for primary care with Dr. Melvyn Novas     Today's televisit is for acute office visit for COVID 19 .  Developed cough and congestion on 04/21/20 .  Tested positive for Covid 19 on 04/22/20  Cough , congestion and fever have resolved. No dyspnea,or rash .  Appetite is good. No loss of taste or smell.  Complains that he has ongoing fatigue and low energy . activiity tolerance is low.  No chest pain , orthopnea or calf pain . No hemoptysis   Did not receive covid vaccine.   Patient Active Problem List   Diagnosis Date Noted  . Essential hypertension 02/29/2020  . Hearing loss of left ear 12/29/2018  . Sore throat 08/07/2018  . Excessive ear wax, bilateral 06/05/2018  . Subungual hematoma of great toe of left foot 01/06/2018  . Tinea cruris 11/28/2017  . Diverticulosis 05/27/2017  . Urinary hesitancy 11/25/2016  . Hypothyroidism, acquired 06/28/2015  . Chronic arthralgias of knees and hips 06/28/2015  . Fatigue 06/23/2015  . Tinnitus 06/12/2014  . Health care maintenance 01/20/2013  . Low back pain 07/27/2011  . ERECTILE DYSFUNCTION 10/23/2008  . INADEQUATE SLEEP HYGIENE (PERSISTENT) 04/26/2008  . Hypercholesteremia 04/25/2008  . PALPITATIONS, HX OF 04/25/2008    Current Outpatient Medications on File Prior to Visit  Medication Sig Dispense Refill  .  clotrimazole-betamethasone (LOTRISONE) cream Apply 1 application topically 2 (two) times daily. Apply twice daily as needed 30 g 5  . diphenhydrAMINE HCl, Sleep, (SLEEP-AID PO) Take 1 tablet by mouth at bedtime as needed.    . eszopiclone 3 MG TABS Take 1 tablet (3 mg total) by mouth at bedtime as needed for sleep. Take immediately before bedtime 30 tablet 1  . ibuprofen (ADVIL,MOTRIN) 200 MG tablet Take 200 mg by mouth as needed.      Marland Kitchen levothyroxine (SYNTHROID) 150 MCG tablet TAKE 1 TABLET BY MOUTH BEFORE BREAKFAST 30 tablet 5  . polyethylene glycol powder (GLYCOLAX/MIRALAX) powder Take 17 grams  1 to 2 times a day . Titrate as needed. 255 g 0  . propranolol (INDERAL) 40 MG tablet Take 1 tablet (40 mg total) by mouth 2 (two) times daily. 60 tablet 11   No current facility-administered medications on file prior to visit.     Observations/Objective: Speaks in full sentences   Assessment and Plan: COVID 19 - appears to be recovering from Mild to Moderate case. No residual cough /fever. No dyspnea. Post viral fatigue and low activity tolerance are to be expected  Advised to push fluids and advance act as tolerated.  Advised of red flag symptoms to be on the look out for and report immediately  Will set up follow up in office in 6 weeks for check up .   Plan  Patient Instructions  Advance activity as tolerated.  Push fluids  Healthy diet.  If develop chest pain, shortness of  breath , fever, cough , call back immediately or go to ER .  Follow up in office in 6 weeks with Dr. Melvyn Novas   and As needed   Please contact office for sooner follow up if symptoms do not improve or worsen or seek emergency care       Follow Up Instructions: Follow up    I discussed the assessment and treatment plan with the patient. The patient was provided an opportunity to ask questions and all were answered. The patient agreed with the plan and demonstrated an understanding of the instructions.   The patient  was advised to call back or seek an in-person evaluation if the symptoms worsen or if the condition fails to improve as anticipated.  I provided 22 minutes of non-face-to-face time during this encounter.   Rexene Edison, NP

## 2020-05-01 NOTE — Patient Instructions (Signed)
Advance activity as tolerated.  Push fluids  Healthy diet.  If develop chest pain, shortness of breath , fever, cough , call back immediately or go to ER .  Follow up in office in 6 weeks with Dr. Melvyn Novas   and As needed   Please contact office for sooner follow up if symptoms do not improve or worsen or seek emergency care

## 2020-05-05 ENCOUNTER — Ambulatory Visit (INDEPENDENT_AMBULATORY_CARE_PROVIDER_SITE_OTHER): Payer: BC Managed Care – PPO | Admitting: Internal Medicine

## 2020-05-05 ENCOUNTER — Encounter: Payer: Self-pay | Admitting: Internal Medicine

## 2020-05-05 ENCOUNTER — Other Ambulatory Visit: Payer: Self-pay

## 2020-05-05 DIAGNOSIS — U071 COVID-19: Secondary | ICD-10-CM

## 2020-05-05 NOTE — Progress Notes (Signed)
Subjective:     Patient ID: Joel Franco, male   DOB: 11-04-1956     MRN: [id number]    Brief patient profile:  40 yowm remote minimal smoker with chronic palpitations that typically occur more at rest than they do with activity and not progressive, if anything less noticeable as he has aged .   History of Present Illness  June 12, 2009 cpx no urinary problems, overall doing fine except for problems staying asleep. rec sleep hygiene reviewed    07/22/2015 NP Follow up : Hypothyroid  Pt returns for evaluation .  Seen last ov with fatigue  Labs showed hypothyroidism with TSH ~8.47  He was started on Synthroid 63mcg.  He is starting to feel better. Less fatigue and joint pains.  Had tick bite with rash in May , given 3 weeks of Doxycycline for  Suspected Lymes dz . He did not go for labs as recommended in May  For Lyme titer. He denies muscle weakness, rash or neck stiffness  No speech issues.  No chest pain, orthopnea, edema or fever.  MW pt here for 4 week follow up. Pt is taking synthroid. Pt c/o fatigue and dry mouth.  rec Increase tsh to 75 mcg daily    11/28/2015  f/u ov/Kyland No re: hypothyroid Chief Complaint  Patient presents with  . Follow-up    Pt c/o restless sleep, insomnia and chronic fatigue. Pt's wife would like to have him checked for sleep apnea. Pt states that his symptoms have been increasingly worse since the tick bite earlier this year.   arthritis better,  Twice a week advil most of the time  Falls asleep fine but wakes up in 2-3 hours then reads x up to 30 min and after sev hours but does not feel refreshed in am  Better p benadryl but feels hungover  rec Tetanus shot today  Try trazodone 25 mg at bedtime    11/28/15 >  05/25/2016  Lots of phone calls/ lmtcb re trazadone side effects/ thyroid dose.   05/25/2016  f/u ov/Ilo Beamon re: cpx  Chief Complaint  Patient presents with  . Annual Exam    Pt is fasting today. He states injured his left knee approx 6  wks ago-seeing [facility]. No other new co's today.   limited by R knee  rec Increased synthroid to 100   11/25/2016  f/u ov/Shmuel Girgis re:  Hypothyroid / urinary hesitance/ no maint meds from urology yet  Chief Complaint  Patient presents with  . Follow-up    denies new co's  R knee surgery set for Dec 13th 2017 Dr Veverly Fells / min need for advil/ taking fish oil for joints  Variable decrease fos despite rx with cipro by urology rec f/u urology yearly due 06/2017    03/24/2017  f/u ov/Arush Gatliff re:  Chief Complaint  Patient presents with  . Follow-up    Stopped synthroid 3-4 wks ago b/c he ran out and states he felt fine. He does c/o constipation, started "forever and a day ago".    mild fatigue, wt gain, constipation but these were chronic issues before stopped the synthroid  Not limited by breathing from desired activities   Variable decrease fos/ f/u by Ottelin but no notes in epic     05/27/2017  f/u ov/Kamauri Denardo re: hypothyroid / urinary hesitance  Chief Complaint  Patient presents with  . Annual Exam    Pt is fasting.  Still has constipation that comes and goes. He states that he  feels very tired all of the time.    due for f/u urology 06/2017 Overdue for colonoscopy 03/2007 last study  Not limited by breathing from desired activities  Main complaint is fatigue ? Worse off synthroid (he denies)   rec If Dr Karsten Ro doesn't call you before 07/27/17 for follow up then you should call him  Please see patient coordinator before you leave today  to schedule GI eval/ colonoscopy due Advil can be taken up to 3-4 with meals as needed for joint pain Please remember to go to the lab department downstairs in the basement  for your tests - we will call you with the results when they are available. Please schedule a follow up visit in 6 months but call sooner if needed Add:  tsh 12 so started back on 100 mcg / day and f/u in 6 weeks with goal tsh < 2         05/29/2018  f/u ov/Shevawn Langenberg re:  cpx  Hypothryoid/urinary hesitance/ HBP Chief Complaint  Patient presents with  . Annual Exam    Pt is fasting. He is doing well and no new co's.   Dyspnea:  Not limited Cough: no  Sleep: fine  SABA use:  None rec No change rx    05/10/2019  f/u ov/Blondie Riggsbee re: hypothyroid - wife lost job and insurance due to [employer] Complaint  Patient presents with  . Annual Exam    Pt is fasting. He states he has lost his insurance since the last appt.   Dyspnea:  Not limited by breathing from desired activities   Cough: minimal assoc with pnds - not taking any meds  Sleeping: flat in bed SABA use: no rec No change rx    02/29/2020  f/u ov/Yecheskel Kurek re: hypothyroid / urinary hesitance/HBP  new Chief Complaint  Patient presents with  . Follow-up    Hypothyroidism, acquired  Dyspnea:  Not limited by breathing from desired activities   Cough: none  Sleeping:  Goes to bed 11 pm falls asleep then up 3 am struggles to get back to sleep but not sleepy during the day/ no better on trazadone, admits stress related to wife's dx of ca SABA use: noe  02: none  rec    Today's televisit is for acute office visit for COVID 19 .  Developed cough and congestion on 04/21/20 .  Tested positive for Covid 19 on 04/22/20  Cough , congestion and fever have resolved. No dyspnea,or rash .  Appetite is good. No loss of taste or smell.  Complains that he has ongoing fatigue and low energy . activiity tolerance is low.  No chest pain , orthopnea or calf pain . No hemoptysis    Virtual Visit via Telephone Note 05/05/2020  F/u covid   I connected with Joel Franco on 05/05/20 at 11:00 AM EDT by telephone and verified that I am speaking with the correct person using two identifiers.   I discussed the limitations, risks, security and privacy concerns of performing an evaluation and management service by telephone and the availability of in person appointments. I also discussed with the patient that there may be a patient  responsible charge related to this service. The patient expressed understanding and agreed to proceed.   History of Present Illness: cc fatigue, cough Dyspnea:  Walked 15 min s stopping but not checking his sats   Cough:   dry severe hacking 3-4 x daily  Sleeping: ok on side one pillow/ slt  elevated bed SABA use: none  02: none    No obvious day to day or daytime variability or assoc excess/ purulent sputum or mucus plugs or hemoptysis or cp or chest tightness, subjective wheeze or overt sinus or hb symptoms.    Also denies any obvious fluctuation of symptoms with weather or environmental changes or other aggravating or alleviating factors except as outlined above.   Meds reviewed/ med reconciliation completed       Observations/Objective: Somber but no conversational sob or spont coughing    Assessment and Plan: See problem list for active a/p's   Follow Up Instructions: See avs for instructions unique to this ov which includes revised/ updated med list     I discussed the assessment and treatment plan with the patient. The patient was provided an opportunity to ask questions and all were answered. The patient agreed with the plan and demonstrated an understanding of the instructions.   The patient was advised to call back or seek an in-person evaluation if the symptoms worsen or if the condition fails to improve as anticipated.  I provided 15 minutes of non-face-to-face time during this encounter.   Christinia Gully, MD               Past Medical History:  HYPERLIPIDEMIA (ICD-272.4)  - target LDL < 130 based on positive family history and remote smoking hx  CHRONIC RHINITIS (ICD-472.0)  PALPITATIONS, HX OF (ICD-V12.50)  DIVERTICULOSIS........................................................................Marland Kitchen     [facility] (was [name])  - See colonoscopy 04/26/07  neck pain positive MRI with right C6/7 protrusion ......................Marland Kitchen      Dr. Ninfa Linden   Urinary Hestiance/ decrease FOS 2017 .........................................  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

## 2020-05-05 NOTE — Assessment & Plan Note (Signed)
Onset of symptoms 04/21/2020 no rx  Now around day 14 and no fever or sob just fits of coughing.   Of the three most common causes of  Sub-acute / recurrent or chronic cough, only one (GERD)  can actually contribute to/ trigger  the other two (asthma and post nasal drip syndrome)  and perpetuate the cylce of cough.  While not intuitively obvious, many patients with chronic low grade reflux do not cough until there is a primary insult that disturbs the protective epithelial barrier and exposes sensitive nerve endings.    >>>  This is typically viral but can due to PNDS and former probably applies here.   The point is that once this occurs, it is difficult to eliminate the cycle  using anything but a maximally effective acid suppression regimen at least in the short run, accompanied by an appropriate diet to address non acid GERD and control / eliminate the cough itself for at least 3 days with delsym and avoid narcotic cough medications if possible   F/u in 2 weeks with cxr if not back to baseline   Each maintenance medication was reviewed in detail including most importantly the difference between maintenance and as needed and under what circumstances the prns are to be used.  Please see AVS for specific  Instructions which are unique to this visit and I personally typed out  which were reviewed in detail over the phone with the patient and a copy provided via MyChart

## 2020-05-05 NOTE — Patient Instructions (Signed)
Try prilosec otc 20mg   Take 30-60 min before first meal of the day and Pepcid ac (famotidine) 20 mg one @  bedtime until cough is completely gone for at least a week without the need for cough suppression  GERD (REFLUX)  is an extremely common cause of respiratory symptoms just like yours , many times with no obvious heartburn at all.    It can be treated with medication, but also with lifestyle changes including elevation of the head of your bed (ideally with 6 -8inch blocks under the headboard of your bed),  Smoking cessation, avoidance of late meals, excessive alcohol, and avoid fatty foods, chocolate, peppermint, colas, red wine, and acidic juices such as orange juice.  NO MINT OR MENTHOL PRODUCTS SO NO COUGH DROPS  USE SUGARLESS CANDY INSTEAD (Jolley ranchers or Stover's or Life Savers) or even ice chips will also do - the key is to swallow to prevent all throat clearing. NO OIL BASED VITAMINS - use powdered substitutes.  Avoid fish oil when coughing.   For cough > delsym 2 tsp every 12 hours as needed   Make sure you check your oxygen saturations at highest level of activity to be sure it stays over 90%    Return if not better to your satisfaction in 2 week and strongly advise you to get the vaccine at least the first shot in 90 days

## 2020-06-03 ENCOUNTER — Ambulatory Visit (INDEPENDENT_AMBULATORY_CARE_PROVIDER_SITE_OTHER): Payer: BC Managed Care – PPO | Admitting: Internal Medicine

## 2020-06-03 ENCOUNTER — Encounter: Payer: Self-pay | Admitting: Internal Medicine

## 2020-06-03 ENCOUNTER — Ambulatory Visit (INDEPENDENT_AMBULATORY_CARE_PROVIDER_SITE_OTHER): Payer: BC Managed Care – PPO

## 2020-06-03 ENCOUNTER — Other Ambulatory Visit: Payer: Self-pay

## 2020-06-03 DIAGNOSIS — E039 Hypothyroidism, unspecified: Secondary | ICD-10-CM

## 2020-06-03 DIAGNOSIS — M25552 Pain in left hip: Secondary | ICD-10-CM

## 2020-06-03 DIAGNOSIS — M25551 Pain in right hip: Secondary | ICD-10-CM | POA: Diagnosis not present

## 2020-06-03 DIAGNOSIS — Z0000 Encounter for general adult medical examination without abnormal findings: Secondary | ICD-10-CM | POA: Diagnosis not present

## 2020-06-03 DIAGNOSIS — U071 COVID-19: Secondary | ICD-10-CM

## 2020-06-03 DIAGNOSIS — M25561 Pain in right knee: Secondary | ICD-10-CM

## 2020-06-03 DIAGNOSIS — G8929 Other chronic pain: Secondary | ICD-10-CM

## 2020-06-03 DIAGNOSIS — I10 Essential (primary) hypertension: Secondary | ICD-10-CM

## 2020-06-03 DIAGNOSIS — E7800 Pure hypercholesterolemia, unspecified: Secondary | ICD-10-CM

## 2020-06-03 DIAGNOSIS — M25562 Pain in left knee: Secondary | ICD-10-CM

## 2020-06-03 LAB — SEDIMENTATION RATE: Sed Rate: 12 mm/hr (ref 0–20)

## 2020-06-03 LAB — CBC WITH DIFFERENTIAL/PLATELET
Basophils Absolute: 0 10*3/uL (ref 0.0–0.1)
Basophils Relative: 0.7 % (ref 0.0–3.0)
Eosinophils Absolute: 0.2 10*3/uL (ref 0.0–0.7)
Eosinophils Relative: 4.2 % (ref 0.0–5.0)
HCT: 42.3 % (ref 39.0–52.0)
Hemoglobin: 14.7 g/dL (ref 13.0–17.0)
Lymphocytes Relative: 24.9 % (ref 12.0–46.0)
Lymphs Abs: 1.3 10*3/uL (ref 0.7–4.0)
MCHC: 34.9 g/dL (ref 30.0–36.0)
MCV: 91.3 fl (ref 78.0–100.0)
Monocytes Absolute: 0.5 10*3/uL (ref 0.1–1.0)
Monocytes Relative: 10 % (ref 3.0–12.0)
Neutro Abs: 3.1 10*3/uL (ref 1.4–7.7)
Neutrophils Relative %: 60.2 % (ref 43.0–77.0)
Platelets: 176 10*3/uL (ref 150.0–400.0)
RBC: 4.63 Mil/uL (ref 4.22–5.81)
RDW: 13.3 % (ref 11.5–15.5)
WBC: 5.2 10*3/uL (ref 4.0–10.5)

## 2020-06-03 LAB — LIPID PANEL
Cholesterol: 179 mg/dL (ref 0–200)
HDL: 32.4 mg/dL — ABNORMAL LOW (ref >39.00)
LDL Cholesterol: 117 mg/dL — ABNORMAL HIGH (ref 0–99)
NonHDL: 146.97
Total CHOL/HDL Ratio: 6
Triglycerides: 152 mg/dL — ABNORMAL HIGH (ref 0.0–149.0)
VLDL: 30.4 mg/dL (ref 0.0–40.0)

## 2020-06-03 LAB — HEPATIC FUNCTION PANEL
ALT: 21 U/L (ref 0–53)
AST: 23 U/L (ref 0–37)
Albumin: 4.2 g/dL (ref 3.5–5.2)
Alkaline Phosphatase: 71 U/L (ref 39–117)
Bilirubin, Direct: 0.2 mg/dL (ref 0.0–0.3)
Total Bilirubin: 0.6 mg/dL (ref 0.2–1.2)
Total Protein: 6.6 g/dL (ref 6.0–8.3)

## 2020-06-03 NOTE — Progress Notes (Signed)
Spoke with pt and notified of results per Dr. [name]. Pt verbalized understanding and denied any questions. 

## 2020-06-03 NOTE — Patient Instructions (Signed)
No change in medications   If aleve does not relieve your hip / groin pain please see Dr Ninfa Linden    Please schedule a follow up visit in 6 months but call sooner if needed

## 2020-06-03 NOTE — Progress Notes (Signed)
Subjective:    Patient ID: Joel Franco, male   DOB: 08-13-1956     MRN: [id number]    Brief patient profile:  43 yowm remote minimal smoker with chronic palpitations that typically occur more at rest than they do with activity and not progressive, if anything less noticeable as he has aged .   History of Present Illness  June 12, 2009 cpx no urinary problems, overall doing fine except for problems staying asleep. rec sleep hygiene reviewed    07/22/2015 NP Follow up : Hypothyroid  Pt returns for evaluation .  Seen last ov with fatigue  Labs showed hypothyroidism with TSH ~8.47  He was started on Synthroid 13mcg.  He is starting to feel better. Less fatigue and joint pains.  Had tick bite with rash in May , given 3 weeks of Doxycycline for  Suspected Lymes dz . He did not go for labs as recommended in May  For Lyme titer. He denies muscle weakness, rash or neck stiffness  No speech issues.  No chest pain, orthopnea, edema or fever.  MW pt here for 4 week follow up. Pt is taking synthroid. Pt c/o fatigue and dry mouth.  rec Increase tsh to 75 mcg daily    11/28/2015  f/u ov/Tait Balistreri re: hypothyroid Chief Complaint  Patient presents with  . Follow-up    Pt c/o restless sleep, insomnia and chronic fatigue. Pt's wife would like to have him checked for sleep apnea. Pt states that his symptoms have been increasingly worse since the tick bite earlier this year.   arthritis better,  Twice a week advil most of the time  Falls asleep fine but wakes up in 2-3 hours then reads x up to 30 min and after sev hours but does not feel refreshed in am  Better p benadryl but feels hungover  rec Tetanus shot today  Try trazodone 25 mg at bedtime    11/28/15 >  05/25/2016  Lots of phone calls/ lmtcb re trazadone side effects/ thyroid dose.   05/25/2016  f/u ov/Ronrico Dupin re: cpx  Chief Complaint  Patient presents with  . Annual Exam    Pt is fasting today. He states injured his left knee approx 6 wks  ago-seeing [facility]. No other new co's today.   limited by R knee  rec Increased synthroid to 100   11/25/2016  f/u ov/Ivalene Platte re:  Hypothyroid / urinary hesitance/ no maint meds from urology yet  Chief Complaint  Patient presents with  . Follow-up    denies new co's  R knee surgery set for Dec 13th 2017 Dr Veverly Fells / min need for advil/ taking fish oil for joints  Variable decrease fos despite rx with cipro by urology rec f/u urology yearly due 06/2017    03/24/2017  f/u ov/Erhardt Dada re:  Chief Complaint  Patient presents with  . Follow-up    Stopped synthroid 3-4 wks ago b/c he ran out and states he felt fine. He does c/o constipation, started "forever and a day ago".    mild fatigue, wt gain, constipation but these were chronic issues before stopped the synthroid  Not limited by breathing from desired activities   Variable decrease fos/ f/u by Ottelin but no notes in epic     05/27/2017  f/u ov/Shivan Hodes re: hypothyroid / urinary hesitance  Chief Complaint  Patient presents with  . Annual Exam    Pt is fasting.  Still has constipation that comes and goes. He states that he feels  very tired all of the time.    due for f/u urology 06/2017 Overdue for colonoscopy 03/2007 last study  Not limited by breathing from desired activities  Main complaint is fatigue ? Worse off synthroid (he denies)   rec If Dr Karsten Ro doesn't call you before 07/27/17 for follow up then you should call him  Please see patient coordinator before you leave today  to schedule GI eval/ colonoscopy due Advil can be taken up to 3-4 with meals as needed for joint pain Please remember to go to the lab department downstairs in the basement  for your tests - we will call you with the results when they are available. Please schedule a follow up visit in 6 months but call sooner if needed Add:  tsh 12 so started back on 100 mcg / day and f/u in 6 weeks with goal tsh < 2         05/29/2018  f/u ov/Jailyn Leeson re:  cpx  Hypothryoid/urinary hesitance/ HBP Chief Complaint  Patient presents with  . Annual Exam    Pt is fasting. He is doing well and no new co's.   Dyspnea:  Not limited Cough: no  Sleep: fine  SABA use:  None rec No change rx        02/29/2020  f/u ov/Charolette Bultman re: hypothyroid / urinary hesitance/HBP  new Chief Complaint  Patient presents with  . Follow-up    Hypothyroidism, acquired  Dyspnea:  Not limited by breathing from desired activities   Cough: none  Sleeping:  Goes to bed 11 pm falls asleep then up 3 am struggles to get back to sleep but not sleepy during the day/ no better on trazadone, admits stress related to wife's dx of ca SABA use: noe  02: none  rec Inderal 40 mg twice daily  We will set you up with a sleep medicine doctor next available  > olalere rec lunesta >  Felt bad the next day   05/01/20  Today's televisit is for acute office visit for COVID 19 .  Developed cough and congestion on 04/21/20 .  Tested positive for Covid 19 on 04/22/20  Cough , congestion and fever have resolved. No dyspnea,or rash .  Appetite is good. No loss of taste or smell.  Complains that he has ongoing fatigue and low energy . activiity tolerance is low.  No chest pain , orthopnea or calf pain . No hemoptysis  rec Advance activity as tolerated.  Push fluids  Healthy diet.  If develop chest pain, shortness of breath , fever, cough , call back immediately or go to ER .    Virtual Visit via Telephone Note 05/05/2020  F/u covid   I connected with Kitt R Blasing on 05/05/20 at 11:00 AM EDT by telephone and verified that I am speaking with the correct person using two identifiers.   I discussed the limitations, risks, security and privacy concerns of performing an evaluation and management service by telephone and the availability of in person appointments. I also discussed with the patient that there may be a patient responsible charge related to this service. The patient expressed understanding  and agreed to proceed.   History of Present Illness: cc fatigue, cough Dyspnea:  Walked 15 min s stopping but not checking his sats   Cough:   dry severe hacking 3-4 x daily  Sleeping: ok on side one pillow/ slt elevated bed SABA use: none  02: none  rec Try prilosec otc 20mg   Take  30-60 min before first meal of the day and Pepcid ac (famotidine) 20 mg one @  bedtime until cough is completely gone for at least a week without the need for cough suppression GERD diet . For cough > delsym 2 tsp every 12 hours as needed  Make sure you check your oxygen saturations at highest level of activity to be sure it stays over 90%   Return if not better to your satisfaction in 2 week and strongly advise you to get the vaccine at least the first shot in 90 days     06/03/2020  f/u ov/Bishoy Cupp re: hypothyroid/ borderline hbp  -  All acute symptoms of covid resolved  Chief Complaint  Patient presents with  . Annual Exam    physical per patient.   Dyspnea:  Back full time remodeling Cough: gone  Sleeping: fine flat / one inhaler SABA use: none  02: none  R hip aleve   No obvious day to day or daytime variability or assoc excess/ purulent sputum or mucus plugs or hemoptysis or cp or chest tightness, subjective wheeze or overt sinus or hb symptoms.   Sleeping better without nocturnal  or early am exacerbation  of respiratory  c/o's or need for noct saba. Also denies any obvious fluctuation of symptoms with weather or environmental changes or other aggravating or alleviating factors except as outlined above   No unusual exposure hx or h/o childhood pna/ asthma or knowledge of premature birth.  Current Allergies, Complete Past Medical History, Past Surgical History, Family History, and Social History were reviewed in [employer] record.  ROS  The following are not active complaints unless bolded Hoarseness, sore throat, dysphagia, dental problems, itching, sneezing,  nasal  congestion or discharge of excess mucus or purulent secretions, ear ache,   fever, chills, sweats, unintended wt loss or wt gain, classically pleuritic or exertional cp,  orthopnea pnd or arm/hand swelling  or leg swelling, presyncope, palpitations, abdominal pain, anorexia, nausea, vomiting, diarrhea  or change in bowel habits or change in bladder habits, change in stools or change in urine, dysuria, hematuria,  rash, arthralgias, visual complaints, headache, numbness, weakness or ataxia or problems with walking or coordination,  change in mood or  Memory.  ED > urology         Current Meds  Medication Sig  . clotrimazole-betamethasone (LOTRISONE) cream Apply 1 application topically 2 (two) times daily. Apply twice daily as needed  . diphenhydrAMINE HCl, Sleep, (SLEEP-AID PO) Take 1 tablet by mouth at bedtime as needed.  . eszopiclone 3 MG TABS Take 1 tablet (3 mg total) by mouth at bedtime as needed for sleep. Take immediately before bedtime  . ibuprofen (ADVIL,MOTRIN) 200 MG tablet Take 200 mg by mouth as needed.    Marland Kitchen levothyroxine (SYNTHROID) 150 MCG tablet TAKE 1 TABLET BY MOUTH BEFORE BREAKFAST  . polyethylene glycol powder (GLYCOLAX/MIRALAX) powder Take 17 grams  1 to 2 times a day . Titrate as needed.                  Past Medical History:  HYPERLIPIDEMIA (ICD-272.4)  - target LDL < 130 based on positive family history and remote smoking hx  CHRONIC RHINITIS (ICD-472.0)  PALPITATIONS, HX OF (ICD-V12.50)  DIVERTICULOSIS........................................................................Marland Kitchen     [facility] (was [name])  - See colonoscopy 04/26/07  neck pain positive MRI with right C6/7 protrusion ......................Marland Kitchen      Dr. 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    NECK :  without JVD/Nodes/TM/ nl carotid upstrokes bilaterally   LUNGS: no acc muscle use,  Nl contour chest which is clear to A and P bilaterally without cough on insp or exp maneuvers   CV:  RRR  no s3 or murmur or increase in P2, and no edema   ABD:  soft and nontender with nl inspiratory excursion in the supine position. No bruits or organomegaly appreciated, bowel sounds nl  MS:  Nl gait/ ext warm without deformities, calf tenderness, cyanosis or clubbing No obvious joint restrictions x mild reduction internal rotation both hips   SKIN: warm and dry without lesions    NEURO:  alert, approp, nl sensorium with  no motor or cerebellar deficits apparent.              CXR PA and Lateral:   06/03/2020 :    I personally reviewed images and agree with radiology impression as follows:   Minimal LEFT perihilar infiltrate question pneumonia.  Labs ordered/ reviewed:      Chemistry      Component Value Date/Time   NA 141 02/29/2020 1551   K 4.1 02/29/2020 1551   CL 105 02/29/2020 1551   CO2 23 02/29/2020 1551   BUN 18 02/29/2020 1551   CREATININE 0.93 02/29/2020 1551      Component Value Date/Time   CALCIUM 9.4 02/29/2020 1551   ALKPHOS 71 06/03/2020 0945   AST 23 06/03/2020 0945   ALT 21 06/03/2020 0945    BILITOT 0.6 06/03/2020 0945        Lab Results  Component Value Date   WBC 5.2 06/03/2020   HGB 14.7 06/03/2020   HCT 42.3 06/03/2020   MCV 91.3 06/03/2020   PLT 176.0 06/03/2020     No results found for: DDIMER    Lab Results  Component Value Date   TSH 2.280 02/29/2020                 Lab Results  Component Value Date   CHOL 179 06/03/2020   HDL 32.40 (L) 06/03/2020   LDLCALC 117 (H) 06/03/2020   LDLDIRECT 138.7 01/19/2013   TRIG 152.0 (H) 06/03/2020   CHOLHDL 6 06/03/2020                 Assessment:

## 2020-06-04 NOTE — Progress Notes (Signed)
LMTCB

## 2020-06-05 ENCOUNTER — Encounter: Payer: Self-pay | Admitting: Internal Medicine

## 2020-06-05 NOTE — Assessment & Plan Note (Signed)
Aleve prn > f/u ortho if not adeq controlled

## 2020-06-05 NOTE — Assessment & Plan Note (Signed)
Up to date

## 2020-06-05 NOTE — Assessment & Plan Note (Signed)
Onset 2016  Synthroid 50 mcg per day started 06/25/15 > increased to 75 mcg per day 09/03/15  - TSH 05/25/2016  = 4.8 on 75 mcg per day > increased to 100 mcg per day > 11/25/2016 =3.02  - stopped synthroid about 02/24/17 > seen 03/24/2017 and euthyroid clinically so ok to leave off unless symptoms  - TSH 12 05/27/2017 > resume synthroid 100 and recheck in 6 weeks > returned 11/28/2017 =  8.37 so rec 125 mcg daily and f/u by 01/27/18 > tsh 05/29/2018 =3.75 on 125 /day > no change rx  - TSH 02/29/2020   = 2.28 on 150 mcg/ day   Clinically and chemically euthyroid, no change rx

## 2020-06-05 NOTE — Assessment & Plan Note (Signed)
Lab Results  Component Value Date   CREATININE 0.93 02/29/2020   CREATININE 0.79 05/29/2018   CREATININE 0.79 05/27/2017     Adequate control on present rx, reviewed in detail with pt > no change in rx needed

## 2020-06-05 NOTE — Assessment & Plan Note (Signed)
Target LDL < 130 based on positive family history and remote smoking hx    Adequate control on present rx, reviewed in detail with pt > no change in rx needed

## 2020-06-05 NOTE — Assessment & Plan Note (Signed)
Onset of symptoms 04/21/2020 no rx  - cxr with minimal residual L perihilar changes 06/03/2020  Clinically resolved with minimal radiologic lag

## 2020-07-07 ENCOUNTER — Other Ambulatory Visit: Payer: Self-pay | Admitting: Internal Medicine

## 2020-08-26 ENCOUNTER — Ambulatory Visit: Payer: BC Managed Care – PPO | Admitting: Internal Medicine

## 2020-12-03 ENCOUNTER — Ambulatory Visit: Payer: BC Managed Care – PPO | Admitting: Internal Medicine

## 2020-12-03 NOTE — Progress Notes (Deleted)
Subjective:    Patient ID: OREY MOURE, male   DOB: 08-13-1956     MRN: [id number]    Brief patient profile:  43 yowm remote minimal smoker with chronic palpitations that typically occur more at rest than they do with activity and not progressive, if anything less noticeable as he has aged .   History of Present Illness  June 12, 2009 cpx no urinary problems, overall doing fine except for problems staying asleep. rec sleep hygiene reviewed    07/22/2015 NP Follow up : Hypothyroid  Pt returns for evaluation .  Seen last ov with fatigue  Labs showed hypothyroidism with TSH ~8.47  He was started on Synthroid 13mcg.  He is starting to feel better. Less fatigue and joint pains.  Had tick bite with rash in May , given 3 weeks of Doxycycline for  Suspected Lymes dz . He did not go for labs as recommended in May  For Lyme titer. He denies muscle weakness, rash or neck stiffness  No speech issues.  No chest pain, orthopnea, edema or fever.  MW pt here for 4 week follow up. Pt is taking synthroid. Pt c/o fatigue and dry mouth.  rec Increase tsh to 75 mcg daily    11/28/2015  f/u ov/Demari Gales re: hypothyroid Chief Complaint  Patient presents with  . Follow-up    Pt c/o restless sleep, insomnia and chronic fatigue. Pt's wife would like to have him checked for sleep apnea. Pt states that his symptoms have been increasingly worse since the tick bite earlier this year.   arthritis better,  Twice a week advil most of the time  Falls asleep fine but wakes up in 2-3 hours then reads x up to 30 min and after sev hours but does not feel refreshed in am  Better p benadryl but feels hungover  rec Tetanus shot today  Try trazodone 25 mg at bedtime    11/28/15 >  05/25/2016  Lots of phone calls/ lmtcb re trazadone side effects/ thyroid dose.   05/25/2016  f/u ov/Ivory Maduro re: cpx  Chief Complaint  Patient presents with  . Annual Exam    Pt is fasting today. He states injured his left knee approx 6 wks  ago-seeing [facility]. No other new co's today.   limited by R knee  rec Increased synthroid to 100   11/25/2016  f/u ov/Azile Minardi re:  Hypothyroid / urinary hesitance/ no maint meds from urology yet  Chief Complaint  Patient presents with  . Follow-up    denies new co's  R knee surgery set for Dec 13th 2017 Dr Veverly Fells / min need for advil/ taking fish oil for joints  Variable decrease fos despite rx with cipro by urology rec f/u urology yearly due 06/2017    03/24/2017  f/u ov/Tahira Olivarez re:  Chief Complaint  Patient presents with  . Follow-up    Stopped synthroid 3-4 wks ago b/c he ran out and states he felt fine. He does c/o constipation, started "forever and a day ago".    mild fatigue, wt gain, constipation but these were chronic issues before stopped the synthroid  Not limited by breathing from desired activities   Variable decrease fos/ f/u by Ottelin but no notes in epic     05/27/2017  f/u ov/Lema Heinkel re: hypothyroid / urinary hesitance  Chief Complaint  Patient presents with  . Annual Exam    Pt is fasting.  Still has constipation that comes and goes. He states that he feels  very tired all of the time.    due for f/u urology 06/2017 Overdue for colonoscopy 03/2007 last study  Not limited by breathing from desired activities  Main complaint is fatigue ? Worse off synthroid (he denies)   rec If Dr Karsten Ro doesn't call you before 07/27/17 for follow up then you should call him  Please see patient coordinator before you leave today  to schedule GI eval/ colonoscopy due Advil can be taken up to 3-4 with meals as needed for joint pain Please remember to go to the lab department downstairs in the basement  for your tests - we will call you with the results when they are available. Please schedule a follow up visit in 6 months but call sooner if needed Add:  tsh 12 so started back on 100 mcg / day and f/u in 6 weeks with goal tsh < 2         05/29/2018  f/u ov/Stanislav Gervase re:  cpx  Hypothryoid/urinary hesitance/ HBP Chief Complaint  Patient presents with  . Annual Exam    Pt is fasting. He is doing well and no new co's.   Dyspnea:  Not limited Cough: no  Sleep: fine  SABA use:  None rec No change rx        02/29/2020  f/u ov/Emmet Messer re: hypothyroid / urinary hesitance/HBP  new Chief Complaint  Patient presents with  . Follow-up    Hypothyroidism, acquired  Dyspnea:  Not limited by breathing from desired activities   Cough: none  Sleeping:  Goes to bed 11 pm falls asleep then up 3 am struggles to get back to sleep but not sleepy during the day/ no better on trazadone, admits stress related to wife's dx of ca SABA use: noe  02: none  rec Inderal 40 mg twice daily  We will set you up with a sleep medicine doctor next available  > olalere rec lunesta >  Felt bad the next day   05/01/20  Today's televisit is for acute office visit for COVID 19 .  Developed cough and congestion on 04/21/20 .  Tested positive for Covid 19 on 04/22/20  Cough , congestion and fever have resolved. No dyspnea,or rash .  Appetite is good. No loss of taste or smell.  Complains that he has ongoing fatigue and low energy . activiity tolerance is low.  No chest pain , orthopnea or calf pain . No hemoptysis  rec Advance activity as tolerated.  Push fluids  Healthy diet.  If develop chest pain, shortness of breath , fever, cough , call back immediately or go to ER .    Virtual Visit via Telephone Note 05/05/2020  F/u covid   I connected with Kitt R Blasing on 05/05/20 at 11:00 AM EDT by telephone and verified that I am speaking with the correct person using two identifiers.   I discussed the limitations, risks, security and privacy concerns of performing an evaluation and management service by telephone and the availability of in person appointments. I also discussed with the patient that there may be a patient responsible charge related to this service. The patient expressed understanding  and agreed to proceed.   History of Present Illness: cc fatigue, cough Dyspnea:  Walked 15 min s stopping but not checking his sats   Cough:   dry severe hacking 3-4 x daily  Sleeping: ok on side one pillow/ slt elevated bed SABA use: none  02: none  rec Try prilosec otc 20mg   Take  30-60 min before first meal of the day and Pepcid ac (famotidine) 20 mg one @  bedtime until cough is completely gone for at least a week without the need for cough suppression GERD diet . For cough > delsym 2 tsp every 12 hours as needed  Make sure you check your oxygen saturations at highest level of activity to be sure it stays over 90%   Return if not better to your satisfaction in 2 week and strongly advise you to get the vaccine at least the first shot in 90 days     06/03/2020  f/u ov/Bryon Parker re: hypothyroid/ borderline hbp  -  All acute symptoms of covid resolved  Chief Complaint  Patient presents with  . Annual Exam    physical per patient.   Dyspnea:  Back full time remodeling Cough: gone  Sleeping: fine flat / one inhaler SABA use: none  02: none  R hip aleve  rec No change in medications  If aleve does not relieve your hip / groin pain please see Dr Ninfa Linden       12/03/2020  f/u ov/Swetha Rayle re:  No chief complaint on file.    Dyspnea:  *** Cough: *** Sleeping: *** SABA use: *** 02: ***   No obvious day to day or daytime variability or assoc excess/ purulent sputum or mucus plugs or hemoptysis or cp or chest tightness, subjective wheeze or overt sinus or hb symptoms.   *** without nocturnal  or early am exacerbation  of respiratory  c/o's or need for noct saba. Also denies any obvious fluctuation of symptoms with weather or environmental changes or other aggravating or alleviating factors except as outlined above   No unusual exposure hx or h/o childhood pna/ asthma or knowledge of premature birth.  Current Allergies, Complete Past Medical History, Past Surgical History, Family  History, and Social History were reviewed in [employer] record.  ROS  The following are not active complaints unless bolded Hoarseness, sore throat, dysphagia, dental problems, itching, sneezing,  nasal congestion or discharge of excess mucus or purulent secretions, ear ache,   fever, chills, sweats, unintended wt loss or wt gain, classically pleuritic or exertional cp,  orthopnea pnd or arm/hand swelling  or leg swelling, presyncope, palpitations, abdominal pain, anorexia, nausea, vomiting, diarrhea  or change in bowel habits or change in bladder habits, change in stools or change in urine, dysuria, hematuria,  rash, arthralgias, visual complaints, headache, numbness, weakness or ataxia or problems with walking or coordination,  change in mood or  memory.        No outpatient medications have been marked as taking for the 12/03/20 encounter (Appointment) with Tanda Rockers, MD.                            Past Medical History:  HYPERLIPIDEMIA (ICD-272.4)  - target LDL < 130 based on positive family history and remote smoking hx  CHRONIC RHINITIS (ICD-472.0)  PALPITATIONS, HX OF (ICD-V12.50)  DIVERTICULOSIS........................................................................Marland Kitchen     [facility] (was [name])  - See colonoscopy 04/26/07  neck pain positive MRI with right C6/7 protrusion ......................Marland Kitchen      Dr. Ninfa Linden (ortho)  Urinary Hestiance/ decrease FOS 2017 .........................................  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

## 2021-04-21 ENCOUNTER — Ambulatory Visit: Payer: BC Managed Care – PPO | Admitting: Internal Medicine

## 2021-04-29 DIAGNOSIS — M5386 Other specified dorsopathies, lumbar region: Secondary | ICD-10-CM | POA: Diagnosis not present

## 2021-04-29 DIAGNOSIS — M9905 Segmental and somatic dysfunction of pelvic region: Secondary | ICD-10-CM | POA: Diagnosis not present

## 2021-04-29 DIAGNOSIS — M9902 Segmental and somatic dysfunction of thoracic region: Secondary | ICD-10-CM | POA: Diagnosis not present

## 2021-04-29 DIAGNOSIS — M9903 Segmental and somatic dysfunction of lumbar region: Secondary | ICD-10-CM | POA: Diagnosis not present

## 2021-05-06 DIAGNOSIS — M5386 Other specified dorsopathies, lumbar region: Secondary | ICD-10-CM | POA: Diagnosis not present

## 2021-05-06 DIAGNOSIS — M9905 Segmental and somatic dysfunction of pelvic region: Secondary | ICD-10-CM | POA: Diagnosis not present

## 2021-05-06 DIAGNOSIS — M9902 Segmental and somatic dysfunction of thoracic region: Secondary | ICD-10-CM | POA: Diagnosis not present

## 2021-05-06 DIAGNOSIS — M9903 Segmental and somatic dysfunction of lumbar region: Secondary | ICD-10-CM | POA: Diagnosis not present

## 2021-05-13 DIAGNOSIS — M7671 Peroneal tendinitis, right leg: Secondary | ICD-10-CM | POA: Diagnosis not present

## 2021-05-13 DIAGNOSIS — M205X2 Other deformities of toe(s) (acquired), left foot: Secondary | ICD-10-CM | POA: Diagnosis not present

## 2021-05-13 DIAGNOSIS — R2689 Other abnormalities of gait and mobility: Secondary | ICD-10-CM | POA: Diagnosis not present

## 2021-05-13 DIAGNOSIS — M792 Neuralgia and neuritis, unspecified: Secondary | ICD-10-CM | POA: Diagnosis not present

## 2021-05-13 DIAGNOSIS — M205X1 Other deformities of toe(s) (acquired), right foot: Secondary | ICD-10-CM | POA: Diagnosis not present

## 2021-05-20 DIAGNOSIS — M5386 Other specified dorsopathies, lumbar region: Secondary | ICD-10-CM | POA: Diagnosis not present

## 2021-05-20 DIAGNOSIS — M9905 Segmental and somatic dysfunction of pelvic region: Secondary | ICD-10-CM | POA: Diagnosis not present

## 2021-05-20 DIAGNOSIS — M9903 Segmental and somatic dysfunction of lumbar region: Secondary | ICD-10-CM | POA: Diagnosis not present

## 2021-05-20 DIAGNOSIS — M9902 Segmental and somatic dysfunction of thoracic region: Secondary | ICD-10-CM | POA: Diagnosis not present

## 2021-06-01 DIAGNOSIS — R972 Elevated prostate specific antigen [PSA]: Secondary | ICD-10-CM | POA: Diagnosis not present

## 2021-06-04 DIAGNOSIS — T1502XA Foreign body in cornea, left eye, initial encounter: Secondary | ICD-10-CM | POA: Diagnosis not present

## 2021-06-04 DIAGNOSIS — H10023 Other mucopurulent conjunctivitis, bilateral: Secondary | ICD-10-CM | POA: Diagnosis not present

## 2021-06-04 DIAGNOSIS — S0502XA Injury of conjunctiva and corneal abrasion without foreign body, left eye, initial encounter: Secondary | ICD-10-CM | POA: Diagnosis not present

## 2021-06-05 DIAGNOSIS — H04123 Dry eye syndrome of bilateral lacrimal glands: Secondary | ICD-10-CM | POA: Diagnosis not present

## 2021-06-08 DIAGNOSIS — N400 Enlarged prostate without lower urinary tract symptoms: Secondary | ICD-10-CM | POA: Diagnosis not present

## 2021-06-08 DIAGNOSIS — M9905 Segmental and somatic dysfunction of pelvic region: Secondary | ICD-10-CM | POA: Diagnosis not present

## 2021-06-08 DIAGNOSIS — R972 Elevated prostate specific antigen [PSA]: Secondary | ICD-10-CM | POA: Diagnosis not present

## 2021-06-08 DIAGNOSIS — N5201 Erectile dysfunction due to arterial insufficiency: Secondary | ICD-10-CM | POA: Diagnosis not present

## 2021-06-08 DIAGNOSIS — M9902 Segmental and somatic dysfunction of thoracic region: Secondary | ICD-10-CM | POA: Diagnosis not present

## 2021-06-08 DIAGNOSIS — M5386 Other specified dorsopathies, lumbar region: Secondary | ICD-10-CM | POA: Diagnosis not present

## 2021-06-08 DIAGNOSIS — M9903 Segmental and somatic dysfunction of lumbar region: Secondary | ICD-10-CM | POA: Diagnosis not present

## 2021-06-17 DIAGNOSIS — M9902 Segmental and somatic dysfunction of thoracic region: Secondary | ICD-10-CM | POA: Diagnosis not present

## 2021-06-17 DIAGNOSIS — M205X2 Other deformities of toe(s) (acquired), left foot: Secondary | ICD-10-CM | POA: Diagnosis not present

## 2021-06-17 DIAGNOSIS — M5386 Other specified dorsopathies, lumbar region: Secondary | ICD-10-CM | POA: Diagnosis not present

## 2021-06-17 DIAGNOSIS — M21961 Unspecified acquired deformity of right lower leg: Secondary | ICD-10-CM | POA: Diagnosis not present

## 2021-06-17 DIAGNOSIS — M9903 Segmental and somatic dysfunction of lumbar region: Secondary | ICD-10-CM | POA: Diagnosis not present

## 2021-06-17 DIAGNOSIS — M65871 Other synovitis and tenosynovitis, right ankle and foot: Secondary | ICD-10-CM | POA: Diagnosis not present

## 2021-06-17 DIAGNOSIS — M7671 Peroneal tendinitis, right leg: Secondary | ICD-10-CM | POA: Diagnosis not present

## 2021-06-17 DIAGNOSIS — M792 Neuralgia and neuritis, unspecified: Secondary | ICD-10-CM | POA: Diagnosis not present

## 2021-06-17 DIAGNOSIS — M9905 Segmental and somatic dysfunction of pelvic region: Secondary | ICD-10-CM | POA: Diagnosis not present

## 2021-06-23 DIAGNOSIS — M25551 Pain in right hip: Secondary | ICD-10-CM | POA: Diagnosis not present

## 2021-07-13 DIAGNOSIS — D171 Benign lipomatous neoplasm of skin and subcutaneous tissue of trunk: Secondary | ICD-10-CM | POA: Diagnosis not present

## 2021-07-13 DIAGNOSIS — L237 Allergic contact dermatitis due to plants, except food: Secondary | ICD-10-CM | POA: Diagnosis not present

## 2021-07-16 DIAGNOSIS — M9903 Segmental and somatic dysfunction of lumbar region: Secondary | ICD-10-CM | POA: Diagnosis not present

## 2021-07-16 DIAGNOSIS — M5386 Other specified dorsopathies, lumbar region: Secondary | ICD-10-CM | POA: Diagnosis not present

## 2021-07-16 DIAGNOSIS — M9905 Segmental and somatic dysfunction of pelvic region: Secondary | ICD-10-CM | POA: Diagnosis not present

## 2021-07-16 DIAGNOSIS — M9902 Segmental and somatic dysfunction of thoracic region: Secondary | ICD-10-CM | POA: Diagnosis not present

## 2021-07-20 ENCOUNTER — Other Ambulatory Visit: Payer: Self-pay | Admitting: Internal Medicine

## 2021-07-20 DIAGNOSIS — M9905 Segmental and somatic dysfunction of pelvic region: Secondary | ICD-10-CM | POA: Diagnosis not present

## 2021-07-20 DIAGNOSIS — M5386 Other specified dorsopathies, lumbar region: Secondary | ICD-10-CM | POA: Diagnosis not present

## 2021-07-20 DIAGNOSIS — M9902 Segmental and somatic dysfunction of thoracic region: Secondary | ICD-10-CM | POA: Diagnosis not present

## 2021-07-20 DIAGNOSIS — M9903 Segmental and somatic dysfunction of lumbar region: Secondary | ICD-10-CM | POA: Diagnosis not present

## 2021-07-27 ENCOUNTER — Telehealth: Payer: Self-pay | Admitting: Internal Medicine

## 2021-07-27 NOTE — Telephone Encounter (Signed)
Called and spoke with Patient.  Patient stated he is currently taking levothyroxine 150 mcg. Patient stated he thinks 150 mcg is to high of a dose and requested levothyroxine 75 mcg. Patient stated he was taking 150 mcg and noticed his hair falling out.  Patient stated he used a pill cutter and cut his levothyroxine 150 mcg in half. Patient stated  his hair stopped falling and he feels fine. Patient stated  if he does not take levothyroxine he feels extremely tired. Patient stated he knows it is time to follow up with Dr. Melvyn Novas, but he is currently working out of town.  Patient stated he will call to schedule OV with Dr. Melvyn Novas once he is back in town. Patient is requesting a prescription of levothyroxine to [facility].    Message routed to Dr. Melvyn Novas to advise

## 2021-07-28 MED ORDER — LEVOTHYROXINE SODIUM 150 MCG PO TABS: ORAL_TABLET | ORAL | 0 refills | Status: DC

## 2021-07-28 NOTE — Telephone Encounter (Signed)
Patient is out of his thyroid medication today and wants more sent in. It has been over a year since he has been seen in our office (06/03/20).  Do you want me to send in one refill and make an appointment ?   Sending to Dr. Melvyn Novas for further recommendations

## 2021-07-28 NOTE — Telephone Encounter (Signed)
Spoke with pt. Appt made for physical with Dr Melvyn Novas in 3 months. Levothyroxine rx sent to pharmacy. Pt verbalized understanding. Nothing further needed.

## 2021-07-28 NOTE — Telephone Encounter (Signed)
Ok to refill and make appt for cpx 3 months (only give 3 m supply)

## 2021-07-30 DIAGNOSIS — M722 Plantar fascial fibromatosis: Secondary | ICD-10-CM | POA: Diagnosis not present

## 2021-07-30 DIAGNOSIS — M21961 Unspecified acquired deformity of right lower leg: Secondary | ICD-10-CM | POA: Diagnosis not present

## 2021-07-30 DIAGNOSIS — M205X1 Other deformities of toe(s) (acquired), right foot: Secondary | ICD-10-CM | POA: Diagnosis not present

## 2021-07-30 DIAGNOSIS — M205X2 Other deformities of toe(s) (acquired), left foot: Secondary | ICD-10-CM | POA: Diagnosis not present

## 2021-08-05 DIAGNOSIS — M9905 Segmental and somatic dysfunction of pelvic region: Secondary | ICD-10-CM | POA: Diagnosis not present

## 2021-08-05 DIAGNOSIS — M9903 Segmental and somatic dysfunction of lumbar region: Secondary | ICD-10-CM | POA: Diagnosis not present

## 2021-08-05 DIAGNOSIS — M5386 Other specified dorsopathies, lumbar region: Secondary | ICD-10-CM | POA: Diagnosis not present

## 2021-08-05 DIAGNOSIS — M9902 Segmental and somatic dysfunction of thoracic region: Secondary | ICD-10-CM | POA: Diagnosis not present

## 2021-09-16 DIAGNOSIS — M9902 Segmental and somatic dysfunction of thoracic region: Secondary | ICD-10-CM | POA: Diagnosis not present

## 2021-09-16 DIAGNOSIS — M9903 Segmental and somatic dysfunction of lumbar region: Secondary | ICD-10-CM | POA: Diagnosis not present

## 2021-09-16 DIAGNOSIS — M5386 Other specified dorsopathies, lumbar region: Secondary | ICD-10-CM | POA: Diagnosis not present

## 2021-09-16 DIAGNOSIS — M9905 Segmental and somatic dysfunction of pelvic region: Secondary | ICD-10-CM | POA: Diagnosis not present

## 2021-09-18 ENCOUNTER — Telehealth: Payer: Self-pay | Admitting: Internal Medicine

## 2021-09-18 NOTE — Telephone Encounter (Signed)
Called and spoke with patient. He verbalized understanding. ? ?Nothing further needed at time of call.  ?

## 2021-09-18 NOTE — Telephone Encounter (Signed)
Called patient. He stated that he has been dealing with diarrhea for the past week. He has been using OTC Imodium without any relief. He has noted some mild stomach cramping on and off. Denies seeing any blood in his stool. Also denied eating an any new restaurants or trying new foods for the past week.   He is currently on vacation in [place] and he is not able to do anything due to the diarrhea. He has been trying to remain hydrated.   He wants to know if there is anything else he needs to do.   MW, can you please advise? Thanks.

## 2021-09-18 NOTE — Telephone Encounter (Signed)
Go to the closest urgent care there - it's probably more than a 24-48 h GI bug

## 2021-09-28 DIAGNOSIS — M9903 Segmental and somatic dysfunction of lumbar region: Secondary | ICD-10-CM | POA: Diagnosis not present

## 2021-09-28 DIAGNOSIS — M5386 Other specified dorsopathies, lumbar region: Secondary | ICD-10-CM | POA: Diagnosis not present

## 2021-09-28 DIAGNOSIS — M9905 Segmental and somatic dysfunction of pelvic region: Secondary | ICD-10-CM | POA: Diagnosis not present

## 2021-09-28 DIAGNOSIS — M9902 Segmental and somatic dysfunction of thoracic region: Secondary | ICD-10-CM | POA: Diagnosis not present

## 2021-10-26 DIAGNOSIS — M9903 Segmental and somatic dysfunction of lumbar region: Secondary | ICD-10-CM | POA: Diagnosis not present

## 2021-10-26 DIAGNOSIS — M5386 Other specified dorsopathies, lumbar region: Secondary | ICD-10-CM | POA: Diagnosis not present

## 2021-10-26 DIAGNOSIS — M9902 Segmental and somatic dysfunction of thoracic region: Secondary | ICD-10-CM | POA: Diagnosis not present

## 2021-10-26 DIAGNOSIS — M9905 Segmental and somatic dysfunction of pelvic region: Secondary | ICD-10-CM | POA: Diagnosis not present

## 2021-10-28 ENCOUNTER — Encounter: Payer: Self-pay | Admitting: Internal Medicine

## 2021-11-09 ENCOUNTER — Encounter: Payer: Self-pay | Admitting: Internal Medicine

## 2021-11-09 ENCOUNTER — Ambulatory Visit (INDEPENDENT_AMBULATORY_CARE_PROVIDER_SITE_OTHER): Payer: Self-pay | Admitting: Internal Medicine

## 2021-11-09 ENCOUNTER — Ambulatory Visit (INDEPENDENT_AMBULATORY_CARE_PROVIDER_SITE_OTHER): Payer: Self-pay

## 2021-11-09 ENCOUNTER — Other Ambulatory Visit: Payer: Self-pay

## 2021-11-09 DIAGNOSIS — J310 Chronic rhinitis: Secondary | ICD-10-CM

## 2021-11-09 DIAGNOSIS — E7800 Pure hypercholesterolemia, unspecified: Secondary | ICD-10-CM

## 2021-11-09 DIAGNOSIS — K5790 Diverticulosis of intestine, part unspecified, without perforation or abscess without bleeding: Secondary | ICD-10-CM

## 2021-11-09 DIAGNOSIS — Z0000 Encounter for general adult medical examination without abnormal findings: Secondary | ICD-10-CM

## 2021-11-09 DIAGNOSIS — E039 Hypothyroidism, unspecified: Secondary | ICD-10-CM

## 2021-11-09 LAB — CBC WITH DIFFERENTIAL/PLATELET
Basophils Absolute: 0 10*3/uL (ref 0.0–0.1)
Basophils Relative: 0.8 % (ref 0.0–3.0)
Eosinophils Absolute: 0.2 10*3/uL (ref 0.0–0.7)
Eosinophils Relative: 4.1 % (ref 0.0–5.0)
HCT: 46.3 % (ref 39.0–52.0)
Hemoglobin: 15.7 g/dL (ref 13.0–17.0)
Lymphocytes Relative: 23.7 % (ref 12.0–46.0)
Lymphs Abs: 1.2 10*3/uL (ref 0.7–4.0)
MCHC: 33.9 g/dL (ref 30.0–36.0)
MCV: 91.6 fl (ref 78.0–100.0)
Monocytes Absolute: 0.4 10*3/uL (ref 0.1–1.0)
Monocytes Relative: 7.7 % (ref 3.0–12.0)
Neutro Abs: 3.2 10*3/uL (ref 1.4–7.7)
Neutrophils Relative %: 63.7 % (ref 43.0–77.0)
Platelets: 217 10*3/uL (ref 150.0–400.0)
RBC: 5.06 Mil/uL (ref 4.22–5.81)
RDW: 12.9 % (ref 11.5–15.5)
WBC: 5 10*3/uL (ref 4.0–10.5)

## 2021-11-09 LAB — HEPATIC FUNCTION PANEL
ALT: 26 U/L (ref 0–53)
AST: 23 U/L (ref 0–37)
Albumin: 4.5 g/dL (ref 3.5–5.2)
Alkaline Phosphatase: 76 U/L (ref 39–117)
Bilirubin, Direct: 0.1 mg/dL (ref 0.0–0.3)
Total Bilirubin: 0.6 mg/dL (ref 0.2–1.2)
Total Protein: 7 g/dL (ref 6.0–8.3)

## 2021-11-09 LAB — BASIC METABOLIC PANEL
BUN: 13 mg/dL (ref 6–23)
CO2: 28 mEq/L (ref 19–32)
Calcium: 9.5 mg/dL (ref 8.4–10.5)
Chloride: 103 mEq/L (ref 96–112)
Creatinine, Ser: 0.86 mg/dL (ref 0.40–1.50)
GFR: 91.37 mL/min (ref >60.00)
Glucose, Bld: 107 mg/dL — ABNORMAL HIGH (ref 70–99)
Potassium: 4.1 mEq/L (ref 3.5–5.1)
Sodium: 138 mEq/L (ref 135–145)

## 2021-11-09 LAB — LIPID PANEL
Cholesterol: 210 mg/dL — ABNORMAL HIGH (ref 0–200)
HDL: 36.1 mg/dL — ABNORMAL LOW (ref >39.00)
LDL Cholesterol: 137 mg/dL — ABNORMAL HIGH (ref 0–99)
NonHDL: 173.87
Total CHOL/HDL Ratio: 6
Triglycerides: 184 mg/dL — ABNORMAL HIGH (ref 0.0–149.0)
VLDL: 36.8 mg/dL (ref 0.0–40.0)

## 2021-11-09 LAB — TSH: TSH: 19.44 u[IU]/mL — ABNORMAL HIGH (ref 0.35–5.50)

## 2021-11-09 NOTE — Patient Instructions (Signed)
We are referring you for GI and Internal medicine  evaluation  Please remember to go to the lab and x-ray department  for your tests - we will call you with the results when they are available.       Pulmonary follow up as needed

## 2021-11-09 NOTE — Progress Notes (Signed)
Subjective:    Patient ID: Joel Franco, male   DOB: 05/31/1956     MRN: [id number]    Brief patient profile:  48  yowm remote minimal smoker with chronic palpitations that typically occur more at rest than they do with activity and not progressive, if anything less noticeable as he has aged .   History of Present Illness  June 12, 2009 cpx no urinary problems, overall doing fine except for problems staying asleep. rec sleep hygiene reviewed    07/22/2015 NP Follow up : Hypothyroid  Pt returns for evaluation .  Seen last ov with fatigue  Labs showed hypothyroidism with TSH ~8.47  He was started on Synthroid 20mcg.  He is starting to feel better. Less fatigue and joint pains.  Had tick bite with rash in May , given 3 weeks of Doxycycline for  Suspected Lymes dz . He did not go for labs as recommended in May  For Lyme titer. He denies muscle weakness, rash or neck stiffness  No speech issues.  No chest pain, orthopnea, edema or fever.  MW pt here for 4 week follow up. Pt is taking synthroid. Pt c/o fatigue and dry mouth.  rec Increase tsh to 75 mcg daily    11/28/2015  f/u ov/Lesleyanne Politte re: hypothyroid Chief Complaint  Patient presents with   Follow-up    Pt c/o restless sleep, insomnia and chronic fatigue. Pt's wife would like to have him checked for sleep apnea. Pt states that his symptoms have been increasingly worse since the tick bite earlier this year.   arthritis better,  Twice a week advil most of the time  Falls asleep fine but wakes up in 2-3 hours then reads x up to 30 min and after sev hours but does not feel refreshed in am  Better p benadryl but feels hungover  rec Tetanus shot today  Try trazodone 25 mg at bedtime    11/28/15 >  05/25/2016  Lots of phone calls/ lmtcb re trazadone side effects/ thyroid dose.   05/25/2016  f/u ov/Jahdiel Krol re: cpx  Chief Complaint  Patient presents with   Annual Exam    Pt is fasting today. He states injured his left knee approx 6 wks  ago-seeing [facility]. No other new co's today.   limited by R knee  rec Increased synthroid to 100   11/25/2016  f/u ov/Kearah Gayden re:  Hypothyroid / urinary hesitance/ no maint meds from urology yet  Chief Complaint  Patient presents with   Follow-up    denies new co's  R knee surgery set for Dec 13th 2017 Dr Veverly Fells / min need for advil/ taking fish oil for joints  Variable decrease fos despite rx with cipro by urology rec f/u urology yearly due 06/2017    03/24/2017  f/u ov/Kanoelani Dobies re:  Chief Complaint  Patient presents with   Follow-up    Stopped synthroid 3-4 wks ago b/c he ran out and states he felt fine. He does c/o constipation, started "forever and a day ago".    mild fatigue, wt gain, constipation but these were chronic issues before stopped the synthroid  Not limited by breathing from desired activities   Variable decrease fos/ f/u by Ottelin but no notes in epic     05/27/2017  f/u ov/Cory Rama re: hypothyroid / urinary hesitance  Chief Complaint  Patient presents with   Annual Exam    Pt is fasting.  Still has constipation that comes and goes. He states that he  feels very tired all of the time.    due for f/u urology 06/2017 Overdue for colonoscopy 03/2007 last study  Not limited by breathing from desired activities  Main complaint is fatigue ? Worse off synthroid (he denies)   rec If Dr Karsten Ro doesn't call you before 07/27/17 for follow up then you should call him  Please see patient coordinator before you leave today  to schedule GI eval/ colonoscopy due Advil can be taken up to 3-4 with meals as needed for joint pain Please remember to go to the lab department downstairs in the basement  for your tests - we will call you with the results when they are available. Please schedule a follow up visit in 6 months but call sooner if needed Add:  tsh 12 so started back on 100 mcg / day and f/u in 6 weeks with goal tsh < 2       06/03/2020  f/u ov/Britainy Kozub re: hypothyroid/ borderline hbp  -   All acute symptoms of covid resolved  Chief Complaint  Patient presents with   Annual Exam    physical per patient.   Dyspnea:  Back full time remodeling Cough: gone  Sleeping: fine flat / one pilow SABA use: none  02: none  R hip rx prn aleve  Rec If aleve does not relieve your hip / groin pain please see Dr Ninfa Linden    11/09/2021  f/u ov/Ruchama Kubicek re: hypothyroid, borderline hbp   maint on synthroid 150 1/4 s/p gi bug sept 23rd - mid oct 2022 while in [place]  o Chief Complaint  Patient presents with   Follow-up    Doing well  Dyspnea: remodeling tol ok s sob / cp Cough: no Sleeping: some nasal congestion x one year intermittent - has not really tried otcs SABA use: none 02: none  Covid status:   no vax / infected April 2020    No obvious day to day or daytime variability or assoc excess/ purulent sputum or mucus plugs or hemoptysis or cp or chest tightness, subjective wheeze or overt sinus or hb symptoms.     Also denies any obvious fluctuation of symptoms with weather or environmental changes or other aggravating or alleviating factors except as outlined above   No unusual exposure hx or h/o childhood pna/ asthma or knowledge of premature birth.  Current Allergies, Complete Past Medical History, Past Surgical History, Family History, and Social History were reviewed in [employer] record.  ROS  The following are not active complaints unless bolded Hoarseness, sore throat, dysphagia, dental problems, itching, sneezing,  nasal congestion or discharge of excess mucus or purulent secretions, ear ache,   fever, chills, sweats, unintended wt loss or wt gain, classically pleuritic or exertional cp,  orthopnea pnd or arm/hand swelling  or leg swelling, presyncope, palpitations, abdominal pain, anorexia, nausea, vomiting, diarrhea  or change in bowel habits or change in bladder habits, change in stools alternating between constipation and diarrhea or change in  urine, dysuria, hematuria,  rash, arthralgias, visual complaints, headache, numbness, weakness or ataxia or problems with walking or coordination,  change in mood or  memory.        Current Meds  Medication Sig   clotrimazole-betamethasone (LOTRISONE) cream Apply 1 application topically 2 (two) times daily. Apply twice daily as needed   diphenhydrAMINE HCl, Sleep, (SLEEP-AID PO) Take 1 tablet by mouth at bedtime as needed.   eszopiclone 3 MG TABS Take 1 tablet (3 mg total) by mouth  at bedtime as needed for sleep. Take immediately before bedtime   ibuprofen (ADVIL,MOTRIN) 200 MG tablet Take 200 mg by mouth as needed.     levothyroxine (SYNTHROID) 150 MCG tablet TAKE 1 TABLET BY MOUTH BEFORE BREAKFAST   polyethylene glycol powder (GLYCOLAX/MIRALAX) powder Take 17 grams  1 to 2 times a day . Titrate as needed.                    Past Medical History:  HYPERLIPIDEMIA (ICD-272.4)  - target LDL < 130 based on positive family history and remote smoking hx  CHRONIC RHINITIS (ICD-472.0)  PALPITATIONS, HX OF (ICD-V12.50)  DIVERTICULOSIS........................................................................Marland Kitchen    [facility]  - See colonoscopy 04/26/07  neck pain positive MRI with right C6/7 protrusion ......................Marland Kitchen      Dr. Ninfa Linden (ortho)  Urinary Hestiance/ decrease FOS 2017 .........................................  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    NECK :  without JVD/Nodes/TM/ nl carotid upstrokes bilaterally   LUNGS: no acc muscle use,  Nl contour chest which is clear to A and P bilaterally without cough on insp or exp maneuvers   CV:  RRR  no s3 or murmur or increase in P2, and no edema   ABD:  soft and nontender with nl inspiratory excursion in the supine position. No bruits or organomegaly appreciated, bowel sounds nl  MS:  Nl gait/ ext warm without deformities, calf tenderness, cyanosis or clubbing No obvious joint restrictions   SKIN: warm and dry without lesions    NEURO:  alert, approp, nl sensorium with  no motor or cerebellar deficits apparent.   GU deferred to urology    Labs ordered/ reviewed:      Chemistry      Component Value Date/Time   NA 138 11/09/2021 0931   NA 141 02/29/2020 1551   K 4.1 11/09/2021 0931   CL 103 11/09/2021 0931   CO2 28 11/09/2021 0931   BUN 13 11/09/2021 0931   BUN 18 02/29/2020 1551   CREATININE 0.86 11/09/2021 0931      Component Value Date/Time   CALCIUM 9.5 11/09/2021 0931   ALKPHOS 76 11/09/2021 0931   AST 23 11/09/2021 0931   ALT 26 11/09/2021 0931   BILITOT 0.6 11/09/2021 0931        Lab Results  Component Value Date   WBC 5.0 11/09/2021   HGB 15.7 11/09/2021   HCT 46.3 11/09/2021   MCV 91.6 11/09/2021   PLT 217.0 11/09/2021  No results found for: DDIMER    Lab Results  Component Value Date   TSH 19.44 (H) 11/09/2021      Lab Results  Component Value Date   CHOL 210 (H) 11/09/2021   HDL 36.10 (L) 11/09/2021   LDLCALC 137 (H) 11/09/2021   LDLDIRECT 138.7 01/19/2013   TRIG 184.0 (H) 11/09/2021   CHOLHDL 6 11/09/2021                            Assessment:

## 2021-11-10 ENCOUNTER — Encounter: Payer: Self-pay | Admitting: Internal Medicine

## 2021-11-10 LAB — IGE: IgE (Immunoglobulin E), Serum: 20 kU/L (ref <=114)

## 2021-11-10 NOTE — Assessment & Plan Note (Signed)
Onset 2016  Synthroid 50 mcg per day started 06/25/15 > increased to 75 mcg per day 09/03/15  - TSH 05/25/2016  = 4.8 on 75 mcg per day > increased to 100 mcg per day > 11/25/2016 =3.02  - stopped synthroid about 02/24/17 > seen 03/24/2017 and euthyroid clinically so ok to leave off unless symptoms  - TSH 12 05/27/2017 > resume synthroid 100 and recheck in 6 weeks > returned 11/28/2017 =  8.37 so rec 125 mcg daily and f/u by 01/27/18 > tsh 05/29/2018 =3.75 on 125 /day > no change rx  - TSH 02/29/2020   = 2.28 on 150 mcg/ day  - TSH 11/09/2021  = 19.44 of 150/4 daily > rec start with 150/2 and referred to IM   He is convinced that he doesn't need replacement but clearly does

## 2021-11-10 NOTE — Assessment & Plan Note (Signed)
Colonoscopy 03/2007 Dr [name] >   referred back to Dr Loletha Carrow 11/09/2021  With ? IBS

## 2021-11-10 NOTE — Assessment & Plan Note (Signed)
-   target LDL < 130 based on positive family history and remote smoking hx   Lab Results  Component Value Date   CHOL 210 (H) 11/09/2021   HDL 36.10 (L) 11/09/2021   LDLCALC 137 (H) 11/09/2021   LDLDIRECT 138.7 01/19/2013   TRIG 184.0 (H) 11/09/2021   CHOLHDL 6 11/09/2021    Adequate control on present rx, reviewed in detail with pt > no change in rx needed

## 2021-11-10 NOTE — Assessment & Plan Note (Signed)
Onset 2021 - Allergy profile 11/09/2021 >  Eos 0.2 /  IgE  Pending   rec zyrtec hs and consider allergy referral depending on results

## 2021-11-11 NOTE — Progress Notes (Signed)
Spoke with pt and notified of results per Dr. Melvyn Novas. Pt verbalized understanding. He is asking if there is another thyroid pill he can take besides levothyroxine that will not cause hair loss. Please advise, thanks!

## 2021-11-30 DIAGNOSIS — N400 Enlarged prostate without lower urinary tract symptoms: Secondary | ICD-10-CM | POA: Diagnosis not present

## 2021-12-07 DIAGNOSIS — M9902 Segmental and somatic dysfunction of thoracic region: Secondary | ICD-10-CM | POA: Diagnosis not present

## 2021-12-07 DIAGNOSIS — N400 Enlarged prostate without lower urinary tract symptoms: Secondary | ICD-10-CM | POA: Diagnosis not present

## 2021-12-07 DIAGNOSIS — M9903 Segmental and somatic dysfunction of lumbar region: Secondary | ICD-10-CM | POA: Diagnosis not present

## 2021-12-07 DIAGNOSIS — M9905 Segmental and somatic dysfunction of pelvic region: Secondary | ICD-10-CM | POA: Diagnosis not present

## 2021-12-07 DIAGNOSIS — M5386 Other specified dorsopathies, lumbar region: Secondary | ICD-10-CM | POA: Diagnosis not present

## 2021-12-07 DIAGNOSIS — R972 Elevated prostate specific antigen [PSA]: Secondary | ICD-10-CM | POA: Diagnosis not present

## 2021-12-09 DIAGNOSIS — M9902 Segmental and somatic dysfunction of thoracic region: Secondary | ICD-10-CM | POA: Diagnosis not present

## 2021-12-09 DIAGNOSIS — M9905 Segmental and somatic dysfunction of pelvic region: Secondary | ICD-10-CM | POA: Diagnosis not present

## 2021-12-09 DIAGNOSIS — M5386 Other specified dorsopathies, lumbar region: Secondary | ICD-10-CM | POA: Diagnosis not present

## 2021-12-09 DIAGNOSIS — M9903 Segmental and somatic dysfunction of lumbar region: Secondary | ICD-10-CM | POA: Diagnosis not present

## 2021-12-15 DIAGNOSIS — M9902 Segmental and somatic dysfunction of thoracic region: Secondary | ICD-10-CM | POA: Diagnosis not present

## 2021-12-15 DIAGNOSIS — M9905 Segmental and somatic dysfunction of pelvic region: Secondary | ICD-10-CM | POA: Diagnosis not present

## 2021-12-15 DIAGNOSIS — M9903 Segmental and somatic dysfunction of lumbar region: Secondary | ICD-10-CM | POA: Diagnosis not present

## 2021-12-15 DIAGNOSIS — M5386 Other specified dorsopathies, lumbar region: Secondary | ICD-10-CM | POA: Diagnosis not present

## 2022-02-06 IMAGING — DX DG CHEST 2V
2 series · 2 of 2 positions shown · non-contrast
Comparison: X-ray chest 06/03/2020.

CLINICAL DATA: High blood pressure.

EXAM:
CHEST - 2 VIEW

[chest pa]
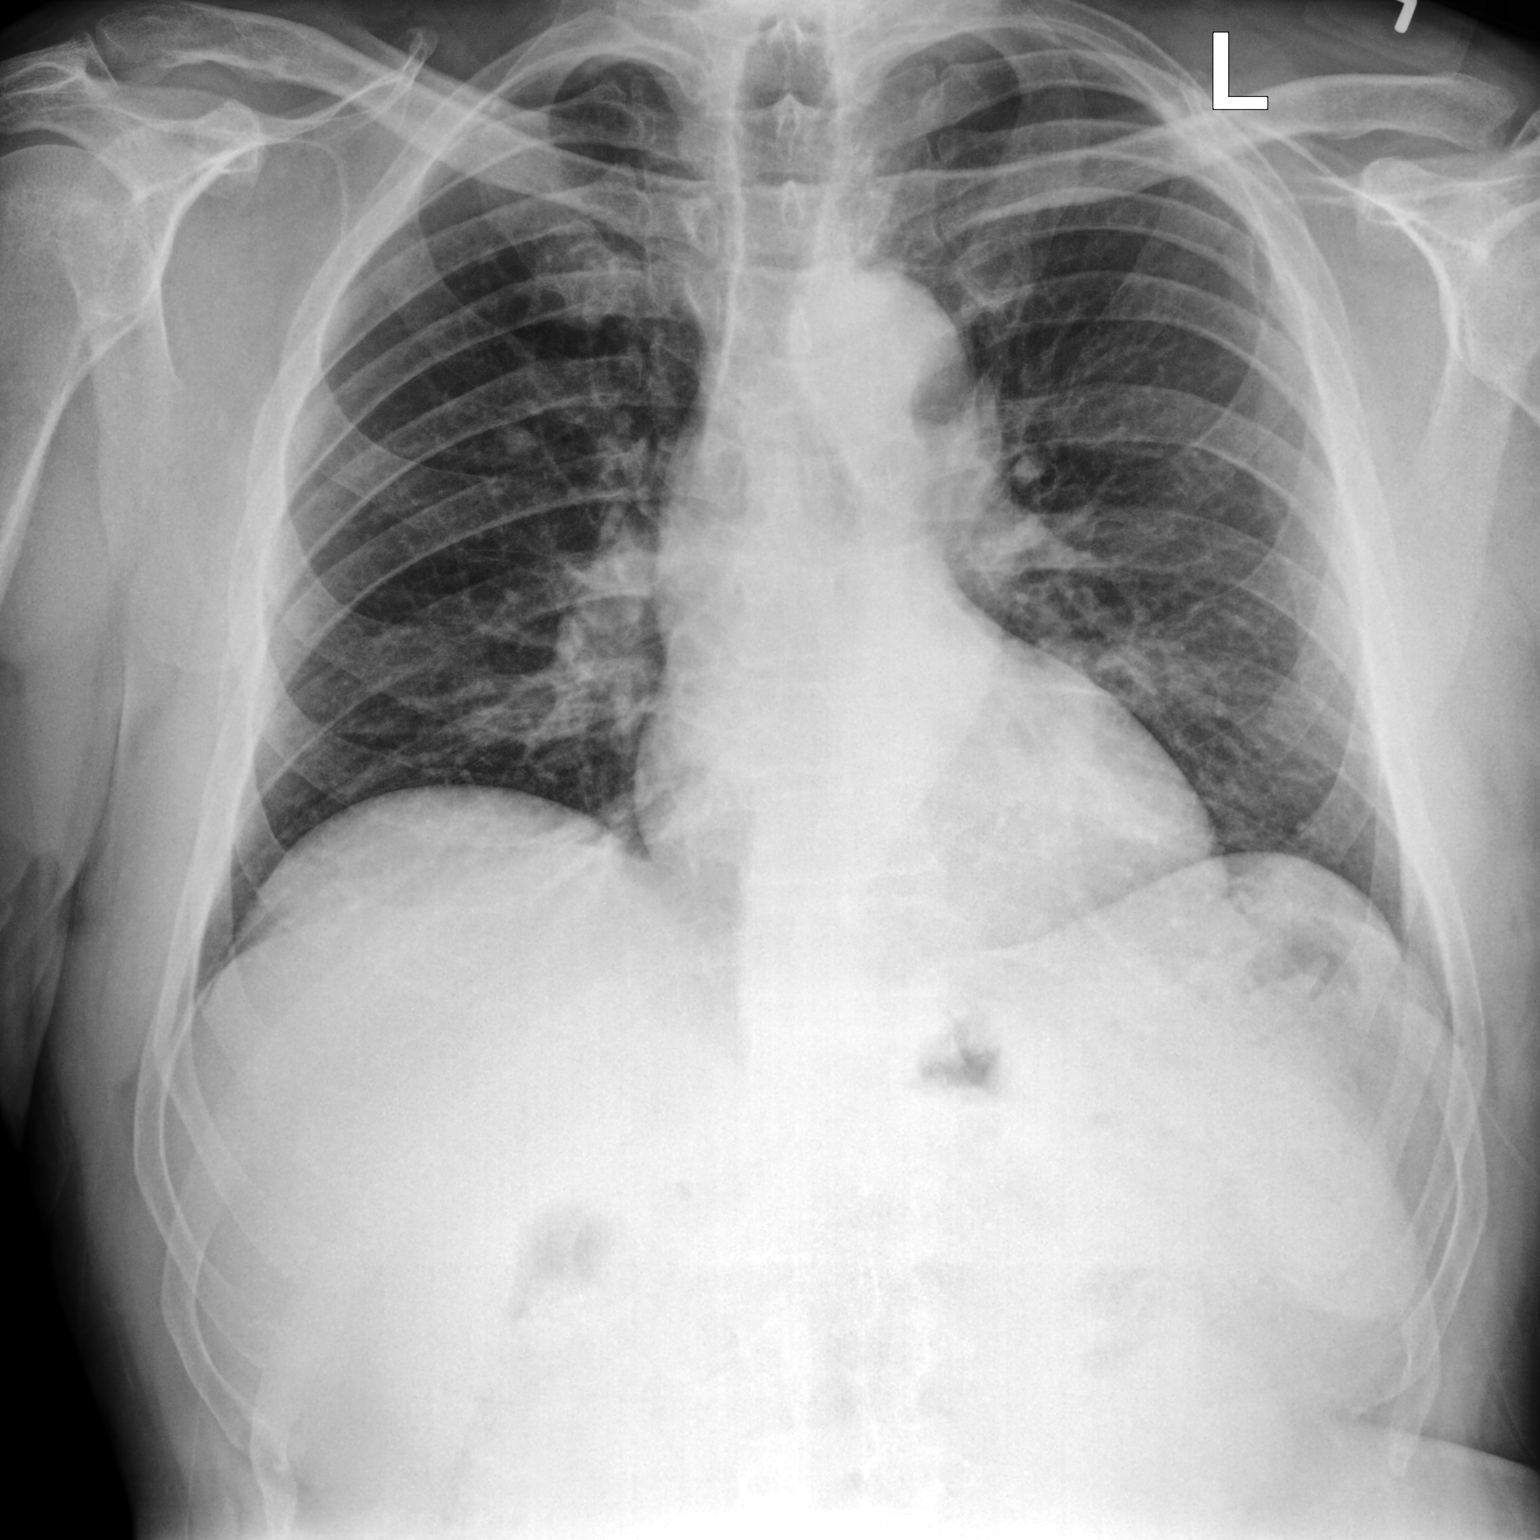

[chest lat]
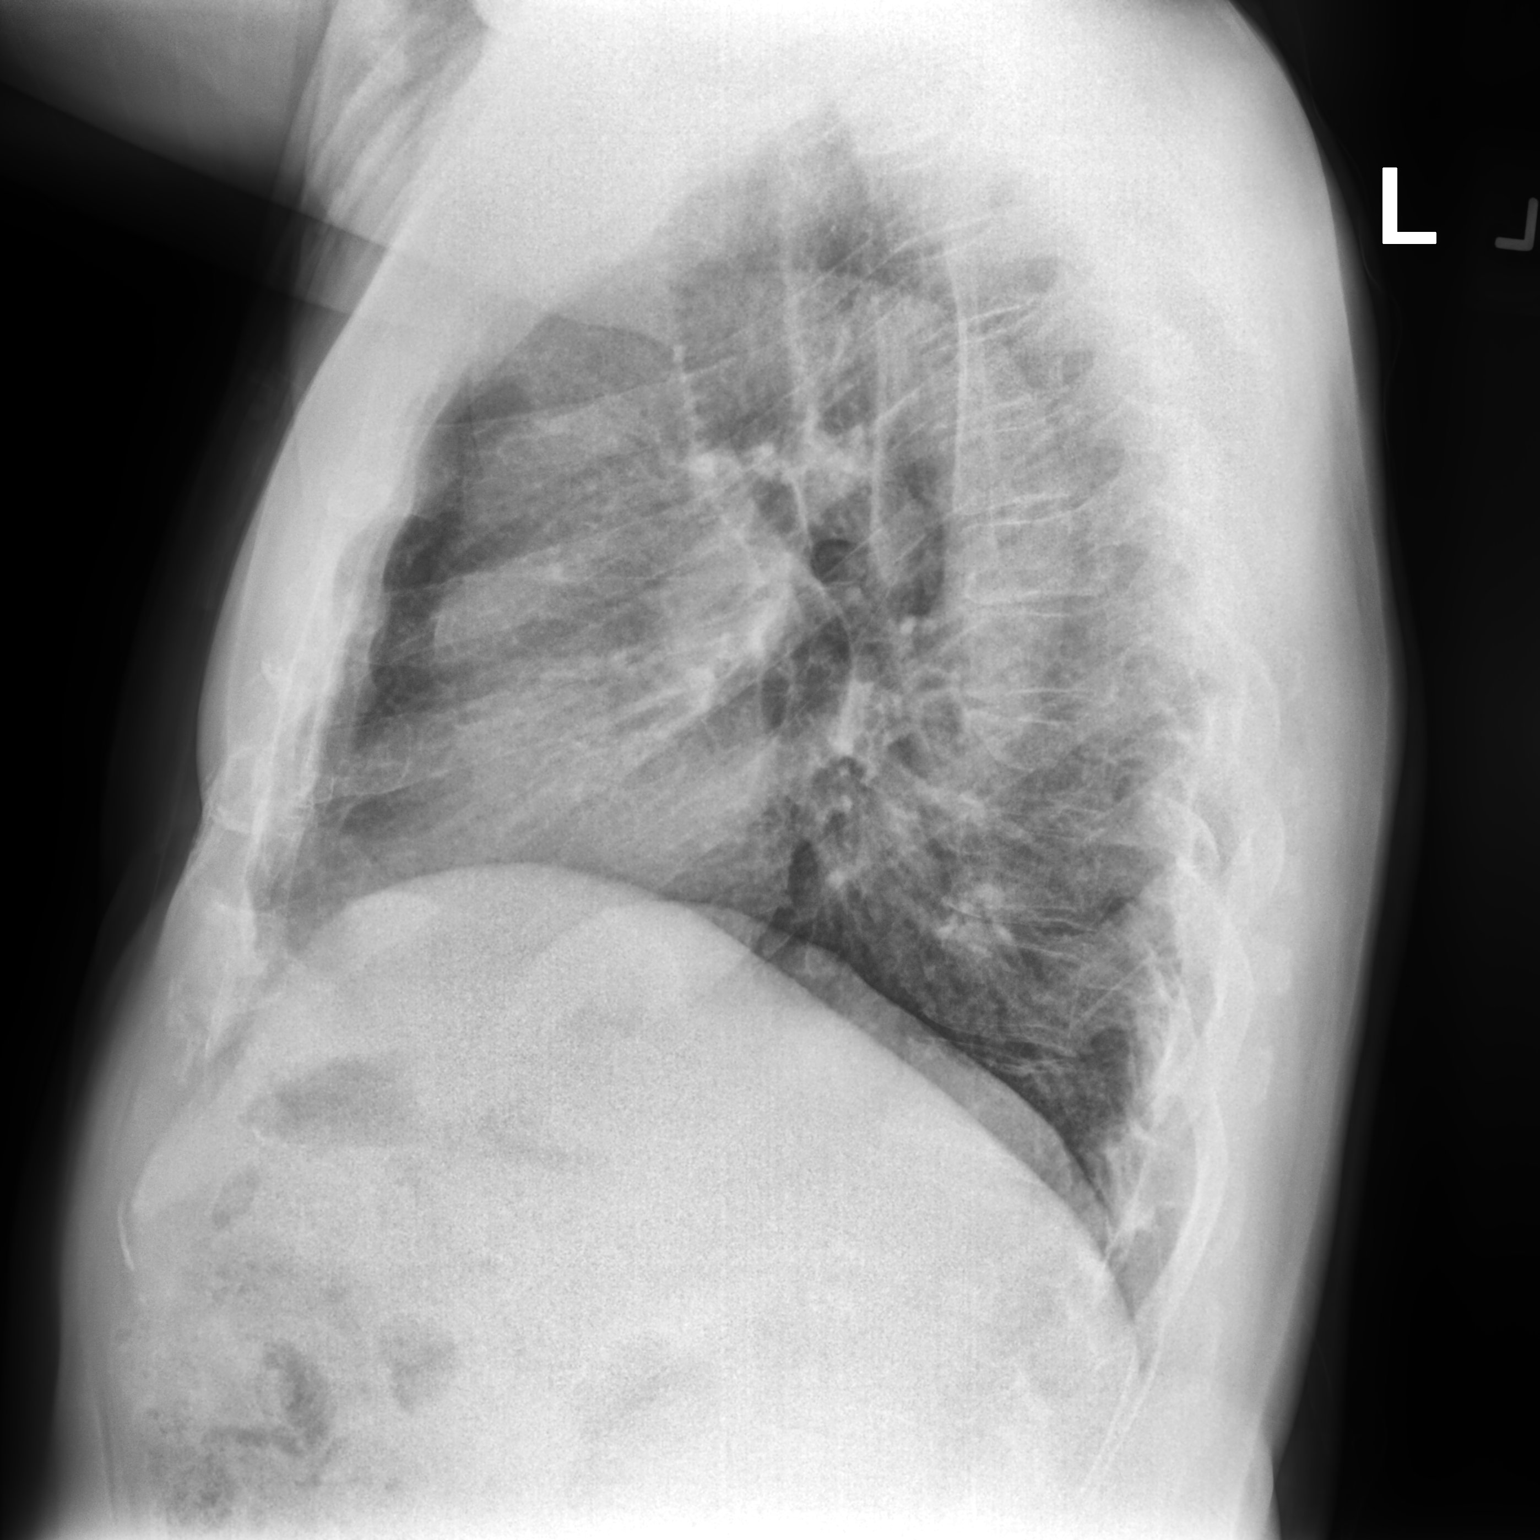

[2 of 2 positions shown; findings below may reference images not displayed]

FINDINGS: The heart size and mediastinal contours are within normal limits.
Both lungs are clear. The visualized skeletal structures are
unremarkable.
IMPRESSION: No active cardiopulmonary disease.

## 2022-02-25 ENCOUNTER — Ambulatory Visit: Payer: Self-pay | Admitting: Emergency Medicine

## 2022-03-22 ENCOUNTER — Encounter: Payer: Self-pay | Admitting: Internal Medicine

## 2022-03-29 DIAGNOSIS — R030 Elevated blood-pressure reading, without diagnosis of hypertension: Secondary | ICD-10-CM | POA: Diagnosis not present

## 2022-03-29 DIAGNOSIS — E039 Hypothyroidism, unspecified: Secondary | ICD-10-CM | POA: Diagnosis not present

## 2022-03-29 DIAGNOSIS — Z87891 Personal history of nicotine dependence: Secondary | ICD-10-CM | POA: Diagnosis not present

## 2022-03-29 DIAGNOSIS — Z136 Encounter for screening for cardiovascular disorders: Secondary | ICD-10-CM | POA: Diagnosis not present

## 2022-03-29 DIAGNOSIS — R0981 Nasal congestion: Secondary | ICD-10-CM | POA: Diagnosis not present

## 2022-03-29 DIAGNOSIS — E785 Hyperlipidemia, unspecified: Secondary | ICD-10-CM | POA: Diagnosis not present

## 2022-03-29 DIAGNOSIS — R739 Hyperglycemia, unspecified: Secondary | ICD-10-CM | POA: Diagnosis not present

## 2022-04-21 DIAGNOSIS — H04123 Dry eye syndrome of bilateral lacrimal glands: Secondary | ICD-10-CM | POA: Diagnosis not present

## 2022-04-30 DIAGNOSIS — Z136 Encounter for screening for cardiovascular disorders: Secondary | ICD-10-CM | POA: Diagnosis not present

## 2022-04-30 DIAGNOSIS — Z87891 Personal history of nicotine dependence: Secondary | ICD-10-CM | POA: Diagnosis not present

## 2022-05-04 DIAGNOSIS — E039 Hypothyroidism, unspecified: Secondary | ICD-10-CM | POA: Diagnosis not present

## 2022-06-07 DIAGNOSIS — R3912 Poor urinary stream: Secondary | ICD-10-CM | POA: Diagnosis not present

## 2022-07-05 DIAGNOSIS — R7303 Prediabetes: Secondary | ICD-10-CM | POA: Diagnosis not present

## 2022-07-05 DIAGNOSIS — Z0000 Encounter for general adult medical examination without abnormal findings: Secondary | ICD-10-CM | POA: Diagnosis not present

## 2022-07-05 DIAGNOSIS — Z87891 Personal history of nicotine dependence: Secondary | ICD-10-CM | POA: Diagnosis not present

## 2022-07-05 DIAGNOSIS — R739 Hyperglycemia, unspecified: Secondary | ICD-10-CM | POA: Diagnosis not present

## 2022-07-05 DIAGNOSIS — R030 Elevated blood-pressure reading, without diagnosis of hypertension: Secondary | ICD-10-CM | POA: Diagnosis not present

## 2022-07-05 DIAGNOSIS — E785 Hyperlipidemia, unspecified: Secondary | ICD-10-CM | POA: Diagnosis not present

## 2022-07-05 DIAGNOSIS — E039 Hypothyroidism, unspecified: Secondary | ICD-10-CM | POA: Diagnosis not present

## 2022-07-05 DIAGNOSIS — R5383 Other fatigue: Secondary | ICD-10-CM | POA: Diagnosis not present

## 2022-07-05 DIAGNOSIS — Z23 Encounter for immunization: Secondary | ICD-10-CM | POA: Diagnosis not present

## 2022-08-02 DIAGNOSIS — Z0000 Encounter for general adult medical examination without abnormal findings: Secondary | ICD-10-CM | POA: Diagnosis not present

## 2022-08-16 DIAGNOSIS — H6690 Otitis media, unspecified, unspecified ear: Secondary | ICD-10-CM | POA: Diagnosis not present

## 2022-08-16 DIAGNOSIS — H93A1 Pulsatile tinnitus, right ear: Secondary | ICD-10-CM | POA: Diagnosis not present

## 2022-08-16 DIAGNOSIS — K5900 Constipation, unspecified: Secondary | ICD-10-CM | POA: Diagnosis not present

## 2022-08-16 DIAGNOSIS — L649 Androgenic alopecia, unspecified: Secondary | ICD-10-CM | POA: Diagnosis not present

## 2022-08-18 DIAGNOSIS — E039 Hypothyroidism, unspecified: Secondary | ICD-10-CM | POA: Diagnosis not present

## 2022-10-05 DIAGNOSIS — L649 Androgenic alopecia, unspecified: Secondary | ICD-10-CM | POA: Diagnosis not present

## 2022-10-05 DIAGNOSIS — R030 Elevated blood-pressure reading, without diagnosis of hypertension: Secondary | ICD-10-CM | POA: Diagnosis not present

## 2022-10-05 DIAGNOSIS — R7303 Prediabetes: Secondary | ICD-10-CM | POA: Diagnosis not present

## 2022-10-05 DIAGNOSIS — E039 Hypothyroidism, unspecified: Secondary | ICD-10-CM | POA: Diagnosis not present

## 2022-10-05 DIAGNOSIS — E785 Hyperlipidemia, unspecified: Secondary | ICD-10-CM | POA: Diagnosis not present

## 2022-11-26 DIAGNOSIS — H00022 Hordeolum internum right lower eyelid: Secondary | ICD-10-CM | POA: Diagnosis not present

## 2022-11-30 DIAGNOSIS — M950 Acquired deformity of nose: Secondary | ICD-10-CM | POA: Diagnosis not present

## 2022-11-30 DIAGNOSIS — G4733 Obstructive sleep apnea (adult) (pediatric): Secondary | ICD-10-CM | POA: Diagnosis not present

## 2022-11-30 DIAGNOSIS — Z9889 Other specified postprocedural states: Secondary | ICD-10-CM | POA: Diagnosis not present

## 2022-11-30 DIAGNOSIS — J343 Hypertrophy of nasal turbinates: Secondary | ICD-10-CM | POA: Diagnosis not present

## 2022-12-03 DIAGNOSIS — H04123 Dry eye syndrome of bilateral lacrimal glands: Secondary | ICD-10-CM | POA: Diagnosis not present

## 2022-12-06 DIAGNOSIS — R3912 Poor urinary stream: Secondary | ICD-10-CM | POA: Diagnosis not present

## 2022-12-13 DIAGNOSIS — L821 Other seborrheic keratosis: Secondary | ICD-10-CM | POA: Diagnosis not present

## 2022-12-13 DIAGNOSIS — D2371 Other benign neoplasm of skin of right lower limb, including hip: Secondary | ICD-10-CM | POA: Diagnosis not present

## 2022-12-13 DIAGNOSIS — L570 Actinic keratosis: Secondary | ICD-10-CM | POA: Diagnosis not present

## 2022-12-13 DIAGNOSIS — L814 Other melanin hyperpigmentation: Secondary | ICD-10-CM | POA: Diagnosis not present

## 2022-12-13 DIAGNOSIS — Z1283 Encounter for screening for malignant neoplasm of skin: Secondary | ICD-10-CM | POA: Diagnosis not present

## 2023-01-11 DIAGNOSIS — J343 Hypertrophy of nasal turbinates: Secondary | ICD-10-CM | POA: Diagnosis not present

## 2023-01-11 DIAGNOSIS — Z9889 Other specified postprocedural states: Secondary | ICD-10-CM | POA: Diagnosis not present

## 2023-01-11 DIAGNOSIS — M950 Acquired deformity of nose: Secondary | ICD-10-CM | POA: Diagnosis not present

## 2023-01-11 DIAGNOSIS — G4733 Obstructive sleep apnea (adult) (pediatric): Secondary | ICD-10-CM | POA: Diagnosis not present

## 2023-03-02 DIAGNOSIS — H524 Presbyopia: Secondary | ICD-10-CM | POA: Diagnosis not present

## 2023-03-02 DIAGNOSIS — H35033 Hypertensive retinopathy, bilateral: Secondary | ICD-10-CM | POA: Diagnosis not present

## 2023-03-08 DIAGNOSIS — R7303 Prediabetes: Secondary | ICD-10-CM | POA: Diagnosis not present

## 2023-03-08 DIAGNOSIS — R197 Diarrhea, unspecified: Secondary | ICD-10-CM | POA: Diagnosis not present

## 2023-03-08 DIAGNOSIS — I10 Essential (primary) hypertension: Secondary | ICD-10-CM | POA: Diagnosis not present

## 2023-03-08 DIAGNOSIS — F5101 Primary insomnia: Secondary | ICD-10-CM | POA: Diagnosis not present

## 2023-03-08 DIAGNOSIS — E039 Hypothyroidism, unspecified: Secondary | ICD-10-CM | POA: Diagnosis not present

## 2023-03-21 ENCOUNTER — Encounter: Payer: Self-pay | Admitting: Dermatology

## 2023-03-21 ENCOUNTER — Ambulatory Visit (INDEPENDENT_AMBULATORY_CARE_PROVIDER_SITE_OTHER): Payer: Medicare Other | Admitting: Dermatology

## 2023-03-21 VITALS — BP 162/89 | HR 72

## 2023-03-21 DIAGNOSIS — L578 Other skin changes due to chronic exposure to nonionizing radiation: Secondary | ICD-10-CM

## 2023-03-21 DIAGNOSIS — D2371 Other benign neoplasm of skin of right lower limb, including hip: Secondary | ICD-10-CM | POA: Diagnosis not present

## 2023-03-21 DIAGNOSIS — L820 Inflamed seborrheic keratosis: Secondary | ICD-10-CM

## 2023-03-21 DIAGNOSIS — L821 Other seborrheic keratosis: Secondary | ICD-10-CM

## 2023-03-21 DIAGNOSIS — D239 Other benign neoplasm of skin, unspecified: Secondary | ICD-10-CM

## 2023-03-21 DIAGNOSIS — L814 Other melanin hyperpigmentation: Secondary | ICD-10-CM

## 2023-03-21 DIAGNOSIS — Z808 Family history of malignant neoplasm of other organs or systems: Secondary | ICD-10-CM

## 2023-03-21 NOTE — Progress Notes (Signed)
   New Patient Visit   Subjective  Joel Franco is a 67 y.o. male who presents for the following: Spot - B/L postauricular/scalp, noticed a few months ago, patient does pick and scratch at them. Fhx of MM in father and cousin, both passed away from it. The patient has spots, moles and lesions to be evaluated, some may be new or changing and the patient has concerns that these could be cancer.  The following portions of the chart were reviewed this encounter and updated as appropriate: medications, allergies, medical history  Review of Systems:  No other skin or systemic complaints except as noted in HPI or Assessment and Plan.  Objective  Well appearing patient in no apparent distress; mood and affect are within normal limits.  A focused examination was performed of the following areas: The face, scalp, and legs   Assessment & Plan   Actinic Damage - chronic, secondary to cumulative UV radiation exposure/sun exposure over time - diffuse scaly erythematous macules with underlying dyspigmentation - Recommend daily broad spectrum sunscreen SPF 30+ to sun-exposed areas, reapply every 2 hours as needed.  - Recommend staying in the shade or wearing long sleeves, sun glasses (UVA+UVB protection) and wide brim hats (4-inch brim around the entire circumference of the hat). - Call for new or changing lesions.  Seborrheic Keratoses - Stuck-on, waxy, tan-brown papules and/or plaques  - Benign-appearing - Discussed benign etiology and prognosis. - Observe - Call for any changes  Lentigines - Scattered tan macules - Due to sun exposure - Benign-appearing, observe - Recommend daily broad spectrum sunscreen SPF 30+ to sun-exposed areas, reapply every 2 hours as needed. - Call for any changes  INFLAMED SEBORRHEIC KERATOSIS  Symptomatic, irritating, patient would like treated.  Benign-appearing.  Call clinic for new or changing lesions.   Prior to procedure, discussed risks of blister  formation, small wound, skin dyspigmentation, or rare scar following treatment. Recommend Vaseline ointment to treated areas while healing.  Destruction Procedure Note Destruction method: cryotherapy   Informed consent: discussed and consent obtained   Lesion destroyed using liquid nitrogen: Yes   Cryotherapy cycles:  2 Outcome: patient tolerated procedure well with no complications   Post-procedure details: wound care instructions given   Locations: supra auricular and scalp x 7 # of Lesions Treated: 7  Family history of skin cancer - what type(s): Melanoma - who affected: Father and fraternal cousin  Dermatofibroma - R med knee  - Firm pink/brown papulenodule with dimple sign - Benign appearing - Call for any changes  Return in about 9 months (around 12/21/2023) for TBSE.  Luther Redo, CMA, am acting as scribe for Sarina Ser, MD .  Documentation: I have reviewed the above documentation for accuracy and completeness, and I agree with the above.  Sarina Ser, MD

## 2023-03-21 NOTE — Patient Instructions (Addendum)
Seborrheic Keratosis  What causes seborrheic keratoses? Seborrheic keratoses are harmless, common skin growths that first appear during adult life.  As time goes by, more growths appear.  Some people may develop a large number of them.  Seborrheic keratoses appear on both covered and uncovered body parts.  They are not caused by sunlight.  The tendency to develop seborrheic keratoses can be inherited.  They vary in color from skin-colored to gray, brown, or even black.  They can be either smooth or have a rough, warty surface.   Seborrheic keratoses are superficial and look as if they were stuck on the skin.  Under the microscope this type of keratosis looks like layers upon layers of skin.  That is why at times the top layer may seem to fall off, but the rest of the growth remains and re-grows.    Treatment Seborrheic keratoses do not need to be treated, but can easily be removed in the office.  Seborrheic keratoses often cause symptoms when they rub on clothing or jewelry.  Lesions can be in the way of shaving.  If they become inflamed, they can cause itching, soreness, or burning.  Removal of a seborrheic keratosis can be accomplished by freezing, burning, or surgery. If any spot bleeds, scabs, or grows rapidly, please return to have it checked, as these can be an indication of a skin cancer.  Due to recent changes in healthcare laws, you may see results of your pathology and/or laboratory studies on MyChart before the doctors have had a chance to review them. We understand that in some cases there may be results that are confusing or concerning to you. Please understand that not all results are received at the same time and often the doctors may need to interpret multiple results in order to provide you with the best plan of care or course of treatment. Therefore, we ask that you please give us 2 business days to thoroughly review all your results before contacting the office for clarification. Should  we see a critical lab result, you will be contacted sooner.   If You Need Anything After Your Visit  If you have any questions or concerns for your doctor, please call our main line at [phone] and press option 4 to reach your doctor's medical assistant. If no one answers, please leave a voicemail as directed and we will return your call as soon as possible. Messages left after 4 pm will be answered the following business day.   You may also send us a message via MyChart. We typically respond to MyChart messages within 1-2 business days.  For prescription refills, please ask your pharmacy to contact our office. Our fax number is [phone].  If you have an urgent issue when the clinic is closed that cannot wait until the next business day, you can page your doctor at the number below.    Please note that while we do our best to be available for urgent issues outside of office hours, we are not available 24/7.   If you have an urgent issue and are unable to reach us, you may choose to seek medical care at your doctor's office, retail clinic, urgent care center, or emergency room.  If you have a medical emergency, please immediately call 911 or go to the emergency department.  Pager Numbers  - Dr. Kowalski: [phone]  - Dr. Moye: [phone]  - Dr. Stewart: [phone]  In the event of inclement weather, please call our main line at   [phone] for an update on the status of any delays or closures.  Dermatology Medication Tips: Please keep the boxes that topical medications come in in order to help keep track of the instructions about where and how to use these. Pharmacies typically print the medication instructions only on the boxes and not directly on the medication tubes.   If your medication is too expensive, please contact our office at [phone] option 4 or send us a message through MyChart.   We are unable to tell what your co-pay for medications will be in  advance as this is different depending on your insurance coverage. However, we may be able to find a substitute medication at lower cost or fill out paperwork to get insurance to cover a needed medication.   If a prior authorization is required to get your medication covered by your insurance company, please allow us 1-2 business days to complete this process.  Drug prices often vary depending on where the prescription is filled and some pharmacies may offer cheaper prices.  The website www.goodrx.com contains coupons for medications through different pharmacies. The prices here do not account for what the cost may be with help from insurance (it may be cheaper with your insurance), but the website can give you the price if you did not use any insurance.  - You can print the associated coupon and take it with your prescription to the pharmacy.  - You may also stop by our office during regular business hours and pick up a GoodRx coupon card.  - If you need your prescription sent electronically to a different pharmacy, notify our office through [facility] MyChart or by phone at [phone] option 4.     Si Usted Necesita Algo Despus de Su Visita  Tambin puede enviarnos un mensaje a travs de MyChart. Por lo general respondemos a los mensajes de MyChart en el transcurso de 1 a 2 das hbiles.  Para renovar recetas, por favor pida a su farmacia que se ponga en contacto con nuestra oficina. Nuestro nmero de fax es el [phone].  Si tiene un asunto urgente cuando la clnica est cerrada y que no puede esperar hasta el siguiente da hbil, puede llamar/localizar a su doctor(a) al nmero que aparece a continuacin.   Por favor, tenga en cuenta que aunque hacemos todo lo posible para estar disponibles para asuntos urgentes fuera del horario de oficina, no estamos disponibles las 24 horas del da, los 7 das de la semana.   Si tiene un problema urgente y no puede comunicarse con nosotros, puede  optar por buscar atencin mdica  en el consultorio de su doctor(a), en una clnica privada, en un centro de atencin urgente o en una sala de emergencias.  Si tiene una emergencia mdica, por favor llame inmediatamente al 911 o vaya a la sala de emergencias.  Nmeros de bper  - Dr. Kowalski: [phone]  - Dra. Moye: [phone]  - Dra. Stewart: [phone]  En caso de inclemencias del tiempo, por favor llame a nuestra lnea principal al [phone] para una actualizacin sobre el estado de cualquier retraso o cierre.  Consejos para la medicacin en dermatologa: Por favor, guarde las cajas en las que vienen los medicamentos de uso tpico para ayudarle a seguir las instrucciones sobre dnde y cmo usarlos. Las farmacias generalmente imprimen las instrucciones del medicamento slo en las cajas y no directamente en los tubos del medicamento.   Si su medicamento es muy caro, por favor, pngase en contacto con   nuestra oficina llamando al [phone] y presione la opcin 4 o envenos un mensaje a travs de MyChart.   No podemos decirle cul ser su copago por los medicamentos por adelantado ya que esto es diferente dependiendo de la cobertura de su seguro. Sin embargo, es posible que podamos encontrar un medicamento sustituto a menor costo o llenar un formulario para que el seguro cubra el medicamento que se considera necesario.   Si se requiere una autorizacin previa para que su compaa de seguros cubra su medicamento, por favor permtanos de 1 a 2 das hbiles para completar este proceso.  Los precios de los medicamentos varan con frecuencia dependiendo del lugar de dnde se surte la receta y alguna farmacias pueden ofrecer precios ms baratos.  El sitio web www.goodrx.com tiene cupones para medicamentos de diferentes farmacias. Los precios aqu no tienen en cuenta lo que podra costar con la ayuda del seguro (puede ser ms barato con su seguro), pero el sitio web puede darle el  precio si no utiliz ningn seguro.  - Puede imprimir el cupn correspondiente y llevarlo con su receta a la farmacia.  - Tambin puede pasar por nuestra oficina durante el horario de atencin regular y recoger una tarjeta de cupones de GoodRx.  - Si necesita que su receta se enve electrnicamente a una farmacia diferente, informe a nuestra oficina a travs de MyChart de [place] o por telfono llamando al [phone] y presione la opcin 4.  

## 2023-03-22 DIAGNOSIS — R350 Frequency of micturition: Secondary | ICD-10-CM | POA: Diagnosis not present

## 2023-03-22 DIAGNOSIS — N401 Enlarged prostate with lower urinary tract symptoms: Secondary | ICD-10-CM | POA: Diagnosis not present

## 2023-03-22 DIAGNOSIS — I10 Essential (primary) hypertension: Secondary | ICD-10-CM | POA: Diagnosis not present

## 2023-03-22 DIAGNOSIS — R6882 Decreased libido: Secondary | ICD-10-CM | POA: Diagnosis not present

## 2023-03-30 DIAGNOSIS — M19071 Primary osteoarthritis, right ankle and foot: Secondary | ICD-10-CM | POA: Diagnosis not present

## 2023-04-20 DIAGNOSIS — E039 Hypothyroidism, unspecified: Secondary | ICD-10-CM | POA: Diagnosis not present

## 2023-05-24 DIAGNOSIS — H10023 Other mucopurulent conjunctivitis, bilateral: Secondary | ICD-10-CM | POA: Diagnosis not present

## 2023-05-24 DIAGNOSIS — S0502XA Injury of conjunctiva and corneal abrasion without foreign body, left eye, initial encounter: Secondary | ICD-10-CM | POA: Diagnosis not present

## 2023-05-25 DIAGNOSIS — H16143 Punctate keratitis, bilateral: Secondary | ICD-10-CM | POA: Diagnosis not present

## 2023-05-30 DIAGNOSIS — R972 Elevated prostate specific antigen [PSA]: Secondary | ICD-10-CM | POA: Diagnosis not present

## 2023-06-01 DIAGNOSIS — H43312 Vitreous membranes and strands, left eye: Secondary | ICD-10-CM | POA: Diagnosis not present

## 2023-06-06 DIAGNOSIS — R3912 Poor urinary stream: Secondary | ICD-10-CM | POA: Diagnosis not present

## 2023-06-06 DIAGNOSIS — N5201 Erectile dysfunction due to arterial insufficiency: Secondary | ICD-10-CM | POA: Diagnosis not present

## 2023-06-06 DIAGNOSIS — R972 Elevated prostate specific antigen [PSA]: Secondary | ICD-10-CM | POA: Diagnosis not present

## 2023-06-06 DIAGNOSIS — N401 Enlarged prostate with lower urinary tract symptoms: Secondary | ICD-10-CM | POA: Diagnosis not present

## 2023-06-15 DIAGNOSIS — H00022 Hordeolum internum right lower eyelid: Secondary | ICD-10-CM | POA: Diagnosis not present

## 2023-07-20 DIAGNOSIS — Z79899 Other long term (current) drug therapy: Secondary | ICD-10-CM | POA: Diagnosis not present

## 2023-07-20 DIAGNOSIS — I10 Essential (primary) hypertension: Secondary | ICD-10-CM | POA: Diagnosis not present

## 2023-07-20 DIAGNOSIS — Z0000 Encounter for general adult medical examination without abnormal findings: Secondary | ICD-10-CM | POA: Diagnosis not present

## 2023-07-20 DIAGNOSIS — E785 Hyperlipidemia, unspecified: Secondary | ICD-10-CM | POA: Diagnosis not present

## 2023-07-20 DIAGNOSIS — R7303 Prediabetes: Secondary | ICD-10-CM | POA: Diagnosis not present

## 2023-07-20 DIAGNOSIS — R5383 Other fatigue: Secondary | ICD-10-CM | POA: Diagnosis not present

## 2023-07-20 DIAGNOSIS — E039 Hypothyroidism, unspecified: Secondary | ICD-10-CM | POA: Diagnosis not present

## 2023-07-22 ENCOUNTER — Other Ambulatory Visit: Payer: Self-pay | Admitting: Internal Medicine

## 2023-07-22 ENCOUNTER — Ambulatory Visit
Admission: RE | Admit: 2023-07-22 | Discharge: 2023-07-22 | Disposition: A | Discharge status: Home / Self Care | Payer: Medicare Other | Source: Ambulatory Visit | Attending: Internal Medicine | Admitting: Internal Medicine

## 2023-07-22 DIAGNOSIS — Z77118 Contact with and (suspected) exposure to other environmental pollution: Secondary | ICD-10-CM | POA: Diagnosis not present

## 2023-07-22 DIAGNOSIS — T7589XA Other specified effects of external causes, initial encounter: Secondary | ICD-10-CM

## 2023-07-27 ENCOUNTER — Other Ambulatory Visit (HOSPITAL_BASED_OUTPATIENT_CLINIC_OR_DEPARTMENT_OTHER): Payer: Self-pay | Admitting: Internal Medicine

## 2023-07-27 DIAGNOSIS — E785 Hyperlipidemia, unspecified: Secondary | ICD-10-CM

## 2023-07-28 ENCOUNTER — Other Ambulatory Visit: Payer: Self-pay | Admitting: Internal Medicine

## 2023-07-28 DIAGNOSIS — R1031 Right lower quadrant pain: Secondary | ICD-10-CM

## 2023-07-28 DIAGNOSIS — K5909 Other constipation: Secondary | ICD-10-CM | POA: Diagnosis not present

## 2023-07-28 DIAGNOSIS — N400 Enlarged prostate without lower urinary tract symptoms: Secondary | ICD-10-CM | POA: Diagnosis not present

## 2023-08-01 ENCOUNTER — Ambulatory Visit
Admission: RE | Admit: 2023-08-01 | Discharge: 2023-08-01 | Disposition: A | Discharge status: Home / Self Care | Payer: Medicare Other | Source: Ambulatory Visit | Attending: Internal Medicine | Admitting: Internal Medicine

## 2023-08-01 DIAGNOSIS — E785 Hyperlipidemia, unspecified: Secondary | ICD-10-CM | POA: Insufficient documentation

## 2023-08-04 ENCOUNTER — Ambulatory Visit
Admission: RE | Admit: 2023-08-04 | Discharge: 2023-08-04 | Disposition: A | Discharge status: Home / Self Care | Payer: Medicare Other | Source: Ambulatory Visit | Attending: Internal Medicine | Admitting: Internal Medicine

## 2023-08-04 DIAGNOSIS — K4090 Unilateral inguinal hernia, without obstruction or gangrene, not specified as recurrent: Secondary | ICD-10-CM | POA: Diagnosis not present

## 2023-08-04 DIAGNOSIS — K429 Umbilical hernia without obstruction or gangrene: Secondary | ICD-10-CM | POA: Diagnosis not present

## 2023-08-04 DIAGNOSIS — N400 Enlarged prostate without lower urinary tract symptoms: Secondary | ICD-10-CM | POA: Diagnosis not present

## 2023-08-04 DIAGNOSIS — K5730 Diverticulosis of large intestine without perforation or abscess without bleeding: Secondary | ICD-10-CM | POA: Diagnosis not present

## 2023-08-04 DIAGNOSIS — R1031 Right lower quadrant pain: Secondary | ICD-10-CM

## 2023-08-25 DIAGNOSIS — K429 Umbilical hernia without obstruction or gangrene: Secondary | ICD-10-CM | POA: Diagnosis not present

## 2023-08-25 DIAGNOSIS — K4090 Unilateral inguinal hernia, without obstruction or gangrene, not specified as recurrent: Secondary | ICD-10-CM | POA: Diagnosis not present

## 2023-09-02 DIAGNOSIS — G4700 Insomnia, unspecified: Secondary | ICD-10-CM | POA: Diagnosis not present

## 2023-09-12 DIAGNOSIS — K429 Umbilical hernia without obstruction or gangrene: Secondary | ICD-10-CM | POA: Diagnosis not present

## 2023-09-12 DIAGNOSIS — K4090 Unilateral inguinal hernia, without obstruction or gangrene, not specified as recurrent: Secondary | ICD-10-CM | POA: Diagnosis not present

## 2023-10-18 DIAGNOSIS — N401 Enlarged prostate with lower urinary tract symptoms: Secondary | ICD-10-CM | POA: Diagnosis not present

## 2023-10-18 DIAGNOSIS — E039 Hypothyroidism, unspecified: Secondary | ICD-10-CM | POA: Diagnosis not present

## 2023-10-18 DIAGNOSIS — R7303 Prediabetes: Secondary | ICD-10-CM | POA: Diagnosis not present

## 2023-10-18 DIAGNOSIS — I10 Essential (primary) hypertension: Secondary | ICD-10-CM | POA: Diagnosis not present

## 2023-11-08 DIAGNOSIS — M79672 Pain in left foot: Secondary | ICD-10-CM | POA: Diagnosis not present

## 2023-11-08 DIAGNOSIS — M7672 Peroneal tendinitis, left leg: Secondary | ICD-10-CM | POA: Diagnosis not present

## 2023-12-08 DIAGNOSIS — R972 Elevated prostate specific antigen [PSA]: Secondary | ICD-10-CM | POA: Diagnosis not present

## 2023-12-08 DIAGNOSIS — N5201 Erectile dysfunction due to arterial insufficiency: Secondary | ICD-10-CM | POA: Diagnosis not present

## 2023-12-08 DIAGNOSIS — R351 Nocturia: Secondary | ICD-10-CM | POA: Diagnosis not present

## 2023-12-08 DIAGNOSIS — N401 Enlarged prostate with lower urinary tract symptoms: Secondary | ICD-10-CM | POA: Diagnosis not present

## 2023-12-23 DIAGNOSIS — E039 Hypothyroidism, unspecified: Secondary | ICD-10-CM | POA: Diagnosis not present

## 2023-12-29 ENCOUNTER — Encounter: Payer: Self-pay | Admitting: Dermatology

## 2023-12-29 ENCOUNTER — Encounter: Payer: Medicare Other | Admitting: Dermatology

## 2024-01-02 DIAGNOSIS — M7672 Peroneal tendinitis, left leg: Secondary | ICD-10-CM | POA: Diagnosis not present

## 2024-01-02 DIAGNOSIS — M216X2 Other acquired deformities of left foot: Secondary | ICD-10-CM | POA: Diagnosis not present

## 2024-01-03 ENCOUNTER — Encounter: Payer: Medicare Other | Admitting: Dermatology

## 2024-01-09 DIAGNOSIS — M25572 Pain in left ankle and joints of left foot: Secondary | ICD-10-CM | POA: Diagnosis not present

## 2024-01-13 DIAGNOSIS — M1812 Unilateral primary osteoarthritis of first carpometacarpal joint, left hand: Secondary | ICD-10-CM | POA: Diagnosis not present

## 2024-01-18 ENCOUNTER — Ambulatory Visit: Payer: Medicare Other | Admitting: Dermatology

## 2024-01-31 ENCOUNTER — Encounter: Payer: Self-pay | Admitting: Dermatology

## 2024-01-31 ENCOUNTER — Ambulatory Visit: Payer: Medicare PPO | Admitting: Dermatology

## 2024-01-31 DIAGNOSIS — Z808 Family history of malignant neoplasm of other organs or systems: Secondary | ICD-10-CM

## 2024-01-31 DIAGNOSIS — L570 Actinic keratosis: Secondary | ICD-10-CM | POA: Diagnosis not present

## 2024-01-31 DIAGNOSIS — W908XXA Exposure to other nonionizing radiation, initial encounter: Secondary | ICD-10-CM

## 2024-01-31 DIAGNOSIS — D1801 Hemangioma of skin and subcutaneous tissue: Secondary | ICD-10-CM

## 2024-01-31 DIAGNOSIS — L578 Other skin changes due to chronic exposure to nonionizing radiation: Secondary | ICD-10-CM

## 2024-01-31 DIAGNOSIS — Z85828 Personal history of other malignant neoplasm of skin: Secondary | ICD-10-CM

## 2024-01-31 DIAGNOSIS — C44319 Basal cell carcinoma of skin of other parts of face: Secondary | ICD-10-CM

## 2024-01-31 DIAGNOSIS — D2371 Other benign neoplasm of skin of right lower limb, including hip: Secondary | ICD-10-CM

## 2024-01-31 DIAGNOSIS — L821 Other seborrheic keratosis: Secondary | ICD-10-CM

## 2024-01-31 DIAGNOSIS — D492 Neoplasm of unspecified behavior of bone, soft tissue, and skin: Secondary | ICD-10-CM

## 2024-01-31 DIAGNOSIS — L814 Other melanin hyperpigmentation: Secondary | ICD-10-CM | POA: Diagnosis not present

## 2024-01-31 DIAGNOSIS — D229 Melanocytic nevi, unspecified: Secondary | ICD-10-CM

## 2024-01-31 DIAGNOSIS — Z1283 Encounter for screening for malignant neoplasm of skin: Secondary | ICD-10-CM

## 2024-01-31 DIAGNOSIS — C4491 Basal cell carcinoma of skin, unspecified: Secondary | ICD-10-CM

## 2024-01-31 DIAGNOSIS — D485 Neoplasm of uncertain behavior of skin: Secondary | ICD-10-CM

## 2024-01-31 DIAGNOSIS — D239 Other benign neoplasm of skin, unspecified: Secondary | ICD-10-CM

## 2024-01-31 HISTORY — DX: Basal cell carcinoma of skin, unspecified: C44.91

## 2024-01-31 NOTE — Patient Instructions (Addendum)
 Gentle Skin Care Guide  1. Bathe no more than once a day.  2. Avoid bathing in hot water  3. Use a mild soap like Dove, Vanicream, Cetaphil, CeraVe. Can use Lever 2000 or Cetaphil antibacterial soap  4. Use soap only where you need it. On most days, use it under your arms, between your legs, and on your feet. Let the water rinse other areas unless visibly dirty.  5. When you get out of the bath/shower, use a towel to gently blot your skin dry, don't rub it.  6. While your skin is still a little damp, apply a moisturizing cream such as Vanicream, CeraVe, Cetaphil, Eucerin, Sarna lotion or plain Vaseline Jelly. For hands apply Neutrogena [place] Hand Cream or Excipial Hand Cream.  7. Reapply moisturizer any time you start to itch or feel dry.  8. Sometimes using free and clear laundry detergents can be helpful. Fabric softener sheets should be avoided. Downy Free & Gentle liquid, or any liquid fabric softener that is free of dyes and perfumes, it acceptable to use  9. If your doctor has given you prescription creams you may apply moisturizers over them     Wound Care Instructions  Cleanse wound gently with soap and water once a day then pat dry with clean gauze. Apply a thin coat of Petrolatum (petroleum jelly, "Vaseline") over the wound (unless you have an allergy to this). We recommend that you use a new, sterile tube of Vaseline. Do not pick or remove scabs. Do not remove the yellow or white "healing tissue" from the base of the wound.  Cover the wound with fresh, clean, nonstick gauze and secure with paper tape. You may use Band-Aids in place of gauze and tape if the wound is small enough, but would recommend trimming much of the tape off as there is often too much. Sometimes Band-Aids can irritate the skin.  You should call the office for your biopsy report after 1 week if you have not already been contacted.  If you experience any problems, such as abnormal amounts of bleeding,  swelling, significant bruising, significant pain, or evidence of infection, please call the office immediately.  FOR ADULT SURGERY PATIENTS: If you need something for pain relief you may take 1 extra strength Tylenol (acetaminophen) AND 2 Ibuprofen (200mg  each) together every 4 hours as needed for pain. (do not take these if you are allergic to them or if you have a reason you should not take them.) Typically, you may only need pain medication for 1 to 3 days.      Due to recent changes in healthcare laws, you may see results of your pathology and/or laboratory studies on MyChart before the doctors have had a chance to review them. We understand that in some cases there may be results that are confusing or concerning to you. Please understand that not all results are received at the same time and often the doctors may need to interpret multiple results in order to provide you with the best plan of care or course of treatment. Therefore, we ask that you please give [place] 2 business days to thoroughly review all your results before contacting the office for clarification. Should we see a critical lab result, you will be contacted sooner.   If You Need Anything After Your Visit  If you have any questions or concerns for your doctor, please call our main line at [phone] and press option 4 to reach your doctor's medical assistant. If no one  answers, please leave a voicemail as directed and we will return your call as soon as possible. Messages left after 4 pm will be answered the following business day.   You may also send [place] a message via MyChart. We typically respond to MyChart messages within 1-2 business days.  For prescription refills, please ask your pharmacy to contact our office. Our fax number is [phone].  If you have an urgent issue when the clinic is closed that cannot wait until the next business day, you can page your doctor at the number below.    Please note that while we do our  best to be available for urgent issues outside of office hours, we are not available 24/7.   If you have an urgent issue and are unable to reach [place], you may choose to seek medical care at your doctor's office, retail clinic, urgent care center, or emergency room.  If you have a medical emergency, please immediately call 911 or go to the emergency department.  Pager Numbers  - Dr. Gwen Pounds: [phone]  - Dr. Roseanne Reno: [phone]  - Dr. Katrinka Blazing: [phone]   In the event of inclement weather, please call our main line at [phone] for an update on the status of any delays or closures.  Dermatology Medication Tips: Please keep the boxes that topical medications come in in order to help keep track of the instructions about where and how to use these. Pharmacies typically print the medication instructions only on the boxes and not directly on the medication tubes.   If your medication is too expensive, please contact our office at [phone] option 4 or send [place] a message through MyChart.   We are unable to tell what your co-pay for medications will be in advance as this is different depending on your insurance coverage. However, we may be able to find a substitute medication at lower cost or fill out paperwork to get insurance to cover a needed medication.   If a prior authorization is required to get your medication covered by your insurance company, please allow [place] 1-2 business days to complete this process.  Drug prices often vary depending on where the prescription is filled and some pharmacies may offer cheaper prices.  The website www.goodrx.com contains coupons for medications through different pharmacies. The prices here do not account for what the cost may be with help from insurance (it may be cheaper with your insurance), but the website can give you the price if you did not use any insurance.  - You can print the associated coupon and take it with your prescription to the  pharmacy.  - You may also stop by our office during regular business hours and pick up a GoodRx coupon card.  - If you need your prescription sent electronically to a different pharmacy, notify our office through [facility] or by phone at [phone] option 4.     Si Usted Necesita Algo Despus de Su Visita  Tambin puede enviarnos un mensaje a travs de Clinical cytogeneticist. Por lo general respondemos a los mensajes de MyChart en el transcurso de 1 a 2 das hbiles.  Para renovar recetas, por favor pida a su farmacia que se ponga en contacto con nuestra oficina. Annie Sable de fax es [place] [phone].  Si tiene un asunto urgente cuando la clnica est cerrada y que no puede esperar hasta el siguiente da hbil, puede llamar/localizar a su doctor(a) al nmero que aparece a continuacin.   Por favor, tenga en cuenta  que aunque hacemos todo lo posible para estar disponibles para asuntos urgentes fuera del horario de [place], no estamos disponibles las 24 horas del da, los 7 [address]  [address] de la [place].   Si tiene un problema urgente y no puede comunicarse con nosotros, puede optar por buscar atencin mdica  en el consultorio de su doctor(a), en una clnica privada, en un centro de atencin urgente o en una sala de emergencias.  Si tiene Engineer, drilling, por favor llame inmediatamente al 911 o vaya a la sala de emergencias.  Nmeros de bper  - Dr. Gwen Pounds: [phone]  - Dra. Roseanne Reno: [phone]  - Dr. Katrinka Blazing: [phone]   En caso de inclemencias del tiempo, por favor llame a Lacy Duverney principal al [phone] para una actualizacin sobre el [place] de cualquier retraso o cierre.  Consejos para la medicacin en dermatologa: Por favor, guarde las cajas en las que vienen los medicamentos de uso tpico para ayudarle a seguir las instrucciones sobre dnde y cmo usarlos. Las farmacias generalmente imprimen las instrucciones del medicamento slo en las cajas y no directamente en los  tubos del [place].   Si su medicamento es muy caro, por favor, pngase en contacto con Rolm Gala llamando al [phone] y presione la opcin 4 o envenos un mensaje a travs de Clinical cytogeneticist.   No podemos decirle cul ser su copago por los medicamentos por adelantado ya que esto es diferente dependiendo de la cobertura de su seguro. Sin embargo, es posible que podamos encontrar un medicamento sustituto a Audiological scientist un formulario para que el seguro cubra el medicamento que se considera necesario.   Si se requiere una autorizacin previa para que su compaa de seguros [place] su medicamento, por favor permtanos de 1 a 2 das hbiles para completar [address]  Los precios de los medicamentos varan con frecuencia dependiendo del Environmental consultant de dnde se surte la receta y alguna farmacias pueden ofrecer precios ms baratos.  El sitio web www.goodrx.com tiene cupones para medicamentos de Health and safety inspector. Los precios aqu no tienen en cuenta lo que podra costar con la ayuda del seguro (puede ser ms barato con su seguro), pero el sitio web puede darle el precio si no utiliz Tourist information centre manager.  - Puede imprimir el cupn correspondiente y llevarlo con su receta a la farmacia.  - Tambin puede pasar por nuestra oficina durante el horario de atencin regular y Education officer, museum una tarjeta de cupones de GoodRx.  - Si necesita que su receta se enve electrnicamente a una farmacia diferente, informe a nuestra oficina a travs de MyChart de [place] o por telfono llamando al [phone] y presione la opcin 4.

## 2024-01-31 NOTE — Progress Notes (Signed)
 Follow-Up Visit   Subjective  Joel Franco is a 68 y.o. male who presents for the following: Skin Cancer Screening and Full Body Skin Exam - pt concerned about lesions on the temple areas that he would like checked today. Hx of BCC on the back, tx in [place], Joel Franco.  The patient presents for Total-Body Skin Exam (TBSE) for skin cancer screening and mole check. The patient has spots, moles and lesions to be evaluated, some may be new or changing and the patient may have concern these could be cancer.  The following portions of the chart were reviewed this encounter and updated as appropriate: medications, allergies, medical history  Review of Systems:  No other skin or systemic complaints except as noted in HPI or Assessment and Plan.  Objective  Well appearing patient in no apparent distress; mood and affect are within normal limits.  A full examination was performed including scalp, head, eyes, ears, nose, lips, neck, chest, axillae, abdomen, back, buttocks, bilateral upper extremities, bilateral lower extremities, hands, feet, fingers, toes, fingernails, and toenails. All findings within normal limits unless otherwise noted below.   Relevant physical exam findings are noted in the Assessment and Plan. R infraorbital cheek 0.6 cm pink papule with telangiectasias  L dorsal hand x 4, L forearm x 1, L zygoma x 1, R forehead x 3, R preauricular x 2, R temple x 1 (12) Erythematous thin papules/macules with gritty scale.   Assessment & Plan   SKIN CANCER SCREENING PERFORMED TODAY.  ACTINIC DAMAGE - Chronic condition, secondary to cumulative UV/sun exposure - diffuse scaly erythematous macules with underlying dyspigmentation - Recommend daily broad spectrum sunscreen SPF 30+ to sun-exposed areas, reapply every 2 hours as needed.  - Staying in the shade or wearing long sleeves, sun glasses (UVA+UVB protection) and wide brim hats (4-inch brim around the entire circumference of the  hat) are also recommended for sun protection.  - Call for new or changing lesions.  LENTIGINES, SEBORRHEIC KERATOSES, HEMANGIOMAS - Benign normal skin lesions - Benign-appearing - Call for any changes  MELANOCYTIC NEVI - Tan-brown and/or pink-flesh-colored symmetric macules and papules - Benign appearing on exam today - Observation - Call clinic for new or changing moles - Recommend daily use of broad spectrum spf 30+ sunscreen to sun-exposed areas.   HISTORY OF BASAL CELL CARCINOMA OF THE SKIN - Back, tx in [facility] - No evidence of recurrence today - Recommend regular full body skin exams - Recommend daily broad spectrum sunscreen SPF 30+ to sun-exposed areas, reapply every 2 hours as needed.  - Call if any new or changing lesions are noted between office visits  FAMILY HISTORY OF SKIN CANCER What type(s):Melanoma  Who affected:Father and fraternal cousin  NEOPLASM OF UNCERTAIN BEHAVIOR OF SKIN R infraorbital cheek Skin / nail biopsy Type of biopsy: tangential   Informed consent: discussed and consent obtained   Timeout: patient name, date of birth, surgical site, and procedure verified   Procedure prep:  Patient was prepped and draped in usual sterile fashion Prep type:  Isopropyl alcohol Anesthesia: the lesion was anesthetized in a standard fashion   Anesthetic:  1% lidocaine  w/ epinephrine 1-100,000 buffered w/ 8.4% NaHCO3 Instrument used: DermaBlade   Hemostasis achieved with: pressure and aluminum chloride   Outcome: patient tolerated procedure well   Post-procedure details: sterile dressing applied and wound care instructions given   Dressing type: bandage and petrolatum   Specimen 1 - Surgical pathology Differential Diagnosis: D48.5 r/o BCC vs  sebaceous hyperplasia Check Margins: No Recommend Mohs if positive for BCC AK (ACTINIC KERATOSIS) (12) L dorsal hand x 4, L forearm x 1, L zygoma x 1, R forehead x 3, R preauricular x 2, R temple x 1 (12) Destruction of  lesion - L dorsal hand x 4, L forearm x 1, L zygoma x 1, R forehead x 3, R preauricular x 2, R temple x 1 (12) Complexity: simple   Destruction method: cryotherapy   Informed consent: discussed and consent obtained   Timeout:  patient name, date of birth, surgical site, and procedure verified Lesion destroyed using liquid nitrogen: Yes   Region frozen until ice ball extended beyond lesion: Yes   Outcome: patient tolerated procedure well with no complications   Post-procedure details: wound care instructions given   MULTIPLE BENIGN NEVI   LENTIGINES   ACTINIC ELASTOSIS   SEBORRHEIC KERATOSES   CHERRY ANGIOMA   DERMATOFIBROMA    DERMATOFIBROMA - R lower leg Exam: Firm pink/brown papulenodule with dimple sign. Treatment Plan: A dermatofibroma is a benign growth possibly related to trauma, such as an insect bite, cut from shaving, or inflamed acne-type bump.  Treatment options to remove include shave or excision with resulting scar and risk of recurrence.  Since benign-appearing and not bothersome, will observe for now.   Return in about 1 year (around 01/30/2025) for TBSE.  Joel Franco Rosina Mayans, CMA, am acting as scribe for Boneta Sharps, MD .   Documentation: I have reviewed the above documentation for accuracy and completeness, and I agree with the above.  Boneta Sharps, MD

## 2024-02-06 LAB — SURGICAL PATHOLOGY

## 2024-02-07 ENCOUNTER — Telehealth: Payer: Self-pay

## 2024-02-07 DIAGNOSIS — C44319 Basal cell carcinoma of skin of other parts of face: Secondary | ICD-10-CM

## 2024-02-07 NOTE — Telephone Encounter (Signed)
-----   Message from [place] sent at 02/06/2024  7:17 PM EST ----- Diagnosis: R infraorbital cheek :       BASAL CELL CARCINOMA, NODULAR PATTERN    Please call with diagnosis and determine where the patient would like to have Mohs surgery. Offer 6 month FBSE  Explanation: your biopsy shows a basal cell skin cancer in the second layer of the skin. This is the most common kind of skin cancer and is caused by damage from sun exposure. Basal cell skin cancers almost never spread beyond the skin, so they are not dangerous to your overall health. However, they will continue to grow, can bleed, cause nonhealing wounds, and disrupt nearby structures unless fully treated.  Treatment: Given the location and type of skin cancer, I recommend Mohs surgery. Mohs surgery involves cutting out the skin cancer and then checking under the microscope to ensure the whole skin cancer was removed. If any skin cancer remains, the surgeon will cut out more until it is fully removed. The cure rate is about 98-99%. Once the Mohs surgeon confirms the skin cancer is out, they will discuss the options to repair or heal the area. You must take it easy for about two weeks after surgery (no lifting over 10-15 lbs, avoid activity to get your heart rate and blood pressure up). It is done at another office outside of [place] ([place], [place], or [place]).

## 2024-02-07 NOTE — Telephone Encounter (Signed)
Discussed pathology results and treatment plan. Patient voiced understanding. Prefers [facility] in [place]. Referral sent to [facility].

## 2024-02-09 DIAGNOSIS — E039 Hypothyroidism, unspecified: Secondary | ICD-10-CM | POA: Diagnosis not present

## 2024-02-17 DIAGNOSIS — R002 Palpitations: Secondary | ICD-10-CM | POA: Diagnosis not present

## 2024-02-17 DIAGNOSIS — R0602 Shortness of breath: Secondary | ICD-10-CM | POA: Diagnosis not present

## 2024-02-17 DIAGNOSIS — R739 Hyperglycemia, unspecified: Secondary | ICD-10-CM | POA: Diagnosis not present

## 2024-02-17 DIAGNOSIS — N401 Enlarged prostate with lower urinary tract symptoms: Secondary | ICD-10-CM | POA: Diagnosis not present

## 2024-02-29 DIAGNOSIS — C44319 Basal cell carcinoma of skin of other parts of face: Secondary | ICD-10-CM | POA: Diagnosis not present

## 2024-03-08 ENCOUNTER — Encounter: Payer: Self-pay | Admitting: Dermatology

## 2024-03-08 ENCOUNTER — Ambulatory Visit: Admitting: Dermatology

## 2024-03-08 DIAGNOSIS — L821 Other seborrheic keratosis: Secondary | ICD-10-CM | POA: Diagnosis not present

## 2024-03-08 DIAGNOSIS — Z85828 Personal history of other malignant neoplasm of skin: Secondary | ICD-10-CM | POA: Diagnosis not present

## 2024-03-08 NOTE — Progress Notes (Signed)
   Follow-Up Visit   Subjective  Joel Franco is a 68 y.o. male who presents for the following: checks spots L infraorbital 2-3 days, no symptoms  The patient has spots, moles and lesions to be evaluated, some may be new or changing and the patient may have concern these could be cancer.   The following portions of the chart were reviewed this encounter and updated as appropriate: medications, allergies, medical history  Review of Systems:  No other skin or systemic complaints except as noted in HPI or Assessment and Plan.  Objective  Well appearing patient in no apparent distress; mood and affect are within normal limits.   A focused examination was performed of the following areas: face  Relevant exam findings are noted in the Assessment and Plan.    Assessment & Plan   SEBORRHEIC KERATOSIS - Stuck-on, waxy, tan-brown papules and/or plaques  - Benign-appearing - Discussed benign etiology and prognosis. - Observe - Call for any changes  HISTORY OF BASAL CELL CARCINOMA OF THE SKIN - No evidence of recurrence today - Recommend regular full body skin exams - Recommend daily broad spectrum sunscreen SPF 30+ to sun-exposed areas, reapply every 2 hours as needed.  - Call if any new or changing lesions are noted between office visits  - R infraorbital cheek mohs 02/29/24 healing well   SEBORRHEIC KERATOSES    Return for as scheduled for TBSE, Hx of BCC, Hx of AKs.  I, Ardis Rowan, RMA, am acting as scribe for Elie Goody, MD .   Documentation: I have reviewed the above documentation for accuracy and completeness, and I agree with the above.  Elie Goody, MD

## 2024-03-08 NOTE — Patient Instructions (Addendum)

## 2024-03-12 ENCOUNTER — Telehealth: Payer: Self-pay

## 2024-03-12 DIAGNOSIS — R002 Palpitations: Secondary | ICD-10-CM | POA: Diagnosis not present

## 2024-03-12 NOTE — Telephone Encounter (Signed)
 Specimen tracking and history updated from [facility] progress notes of BCC - R infraorbital cheek. aw

## 2024-03-13 DIAGNOSIS — R002 Palpitations: Secondary | ICD-10-CM | POA: Diagnosis not present

## 2024-03-19 DIAGNOSIS — M9901 Segmental and somatic dysfunction of cervical region: Secondary | ICD-10-CM | POA: Diagnosis not present

## 2024-03-19 DIAGNOSIS — M9902 Segmental and somatic dysfunction of thoracic region: Secondary | ICD-10-CM | POA: Diagnosis not present

## 2024-03-19 DIAGNOSIS — M9903 Segmental and somatic dysfunction of lumbar region: Secondary | ICD-10-CM | POA: Diagnosis not present

## 2024-03-19 DIAGNOSIS — M5124 Other intervertebral disc displacement, thoracic region: Secondary | ICD-10-CM | POA: Diagnosis not present

## 2024-03-19 DIAGNOSIS — M5022 Other cervical disc displacement, mid-cervical region, unspecified level: Secondary | ICD-10-CM | POA: Diagnosis not present

## 2024-03-19 DIAGNOSIS — M5126 Other intervertebral disc displacement, lumbar region: Secondary | ICD-10-CM | POA: Diagnosis not present

## 2024-03-20 DIAGNOSIS — H35033 Hypertensive retinopathy, bilateral: Secondary | ICD-10-CM | POA: Diagnosis not present

## 2024-03-22 DIAGNOSIS — E039 Hypothyroidism, unspecified: Secondary | ICD-10-CM | POA: Diagnosis not present

## 2024-03-22 DIAGNOSIS — E785 Hyperlipidemia, unspecified: Secondary | ICD-10-CM | POA: Diagnosis not present

## 2024-03-22 DIAGNOSIS — F5101 Primary insomnia: Secondary | ICD-10-CM | POA: Diagnosis not present

## 2024-03-22 DIAGNOSIS — R002 Palpitations: Secondary | ICD-10-CM | POA: Diagnosis not present

## 2024-03-22 DIAGNOSIS — R7303 Prediabetes: Secondary | ICD-10-CM | POA: Diagnosis not present

## 2024-03-22 DIAGNOSIS — G479 Sleep disorder, unspecified: Secondary | ICD-10-CM | POA: Diagnosis not present

## 2024-03-22 DIAGNOSIS — N401 Enlarged prostate with lower urinary tract symptoms: Secondary | ICD-10-CM | POA: Diagnosis not present

## 2024-03-22 DIAGNOSIS — I10 Essential (primary) hypertension: Secondary | ICD-10-CM | POA: Diagnosis not present

## 2024-03-22 DIAGNOSIS — K5900 Constipation, unspecified: Secondary | ICD-10-CM | POA: Diagnosis not present

## 2024-03-26 ENCOUNTER — Encounter: Payer: Self-pay | Admitting: Cardiovascular Disease

## 2024-03-26 ENCOUNTER — Ambulatory Visit: Attending: Cardiovascular Disease | Admitting: Cardiovascular Disease

## 2024-03-26 VITALS — BP 104/76 | HR 62 | Ht 75.0 in | Wt 230.0 lb

## 2024-03-26 DIAGNOSIS — I10 Essential (primary) hypertension: Secondary | ICD-10-CM

## 2024-03-26 DIAGNOSIS — E7800 Pure hypercholesterolemia, unspecified: Secondary | ICD-10-CM | POA: Diagnosis not present

## 2024-03-26 DIAGNOSIS — R002 Palpitations: Secondary | ICD-10-CM | POA: Diagnosis not present

## 2024-03-26 MED ORDER — ATORVASTATIN CALCIUM 20 MG PO TABS: 20.0000 mg | ORAL_TABLET | Freq: Every day | ORAL | 3 refills | Status: DC

## 2024-03-26 NOTE — Patient Instructions (Signed)
 Medication Instructions:  Your physician has recommended you make the following change in your medication:   -Start atorvastatin (lipitor) 20mg  once daily.  *If you need a refill on your cardiac medications before your next appointment, please call your pharmacy*  Lab Work: Your physician recommends that you return for lab work in: 3 months for FASTING lipid/liver panel.  If you have labs (blood work) drawn today and your tests are completely normal, you will receive your results only by: MyChart Message (if you have MyChart) OR A paper copy in the mail If you have any lab test that is abnormal or we need to change your treatment, we will call you to review the results.  Testing/Procedures: Your physician has requested that you have an echocardiogram. Echocardiography is a painless test that uses sound waves to create images of your heart. It provides your doctor with information about the size and shape of your heart and how well your heart's chambers and valves are working. This procedure takes approximately one hour. There are no restrictions for this procedure. Please do NOT wear cologne, perfume, aftershave, or lotions (deodorant is allowed). Please arrive 15 minutes prior to your appointment time.  Please note: We ask at that you not bring children with you during ultrasound (echo/ vascular) testing. Due to room size and safety concerns, children are not allowed in the ultrasound rooms during exams. Our front office staff cannot provide observation of children in our lobby area while testing is being conducted. An adult accompanying a patient to their appointment will only be allowed in the ultrasound room at the discretion of the ultrasound technician under special circumstances. We apologize for any inconvenience.   Follow-Up: At [facility], you and your health needs are our priority.  As part of our continuing mission to provide you with exceptional heart care, our  providers are all part of one team.  This team includes your primary Cardiologist (physician) and Advanced Practice Providers or APPs (Physician Assistants and Nurse Practitioners) who all work together to provide you with the care you need, when you need it.  Your next appointment:   6 month(s)  Provider:   Nanetta Batty, MD   We recommend signing up for the patient portal called "MyChart".  Sign up information is provided on this After Visit Summary.  MyChart is used to connect with patients for Virtual Visits (Telemedicine).  Patients are able to view lab/test results, encounter notes, upcoming appointments, etc.  Non-urgent messages can be sent to your provider as well.   To learn more about what you can do with MyChart, go to [email or web address].   Other Instructions       1st Floor: - Lobby - Registration  - Pharmacy  - Lab - Cafe  2nd Floor: - PV Lab - Diagnostic Testing (echo, CT, nuclear med)  3rd Floor: - Vacant  4th Floor: - TCTS (cardiothoracic surgery) - AFib Clinic - Structural Heart Clinic - Vascular Surgery  - Vascular Ultrasound  5th Floor: - [facility] Cardiology (general and EP) - Clinical Pharmacy for coumadin, hypertension, lipid, weight-loss medications, and med management appointments    Valet parking services will be available as well.

## 2024-03-26 NOTE — Progress Notes (Signed)
 03/26/2024 Joel Franco   1956/11/19  [id number]  Primary Physician Orson Aloe Sherie Don, MD Primary Cardiologist: Runell Gess MD Nicholes Calamity, [name]  HPI:  LEVENT KORNEGAY is a 68 y.o. 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  He was referred by his PCP, Dr. Eleanora Neighbor, for evaluation of palpitations.  His only risk factors are untreated mild hyperlipidemia.  His father had a myocardial infarction at age 85.  He is never had a heart attack or stroke.  He denies chest pain or shortness of breath Allyson Sabal is fairly active and exercises in the gym doing cardio treadmill and bicycle as well as weights without limitation.  He and his wife both had the flu back in February were treated with Tamiflu.  His symptoms lasted for proxy 2 weeks then resolved.  During that time he developed tachypalpitations which have since gotten much better spontaneously.  He did wear a Zio patch for 2 weeks that showed runs of SVT and NSVT.   Current Meds  Medication Sig   alfuzosin (UROXATRAL) 10 MG 24 hr tablet Take 10 mg by mouth daily with breakfast.   amLODipine (NORVASC) 5 MG tablet Take 5 mg by mouth daily.   ibuprofen (ADVIL,MOTRIN) 200 MG tablet Take 200 mg by mouth as needed.     levothyroxine (SYNTHROID) 150 MCG tablet TAKE 1 TABLET BY MOUTH BEFORE BREAKFAST   SYNTHROID 125 MCG tablet Take 125 mcg by mouth daily before breakfast.     Allergies  Allergen Reactions   Amoxicillin     Social History   Socioeconomic History   Marital status: Married    Spouse name: Not on file   Number of children: 2   Years of education: Not on file   Highest education level: Not on file  Occupational History   Occupation: Chief of Staff: Production assistant, radio FOR SELF EMPLOYED  Tobacco Use   Smoking status: Former    Current packs/day: 0.00    Types: Cigarettes     Quit date: 12/28/1991    Years since quitting: 32.2   Smokeless tobacco: Never   Tobacco comments:    "smoked on and off only occ"  Substance and Sexual Activity   Alcohol use: No   Drug use: No   Sexual activity: Not on file  Other Topics Concern   Not on file  Social History Narrative   Not on file   Social Drivers of Health   Financial Resource Strain: Not on file  Food Insecurity: Not on file  Transportation Needs: Not on file  Physical Activity: Not on file  Stress: Not on file  Social Connections: Not on file  Intimate Partner Violence: Not on file     Review of Systems: General: negative for chills, fever, night sweats or weight changes.  Cardiovascular: negative for chest pain, dyspnea on exertion, edema, orthopnea, palpitations, paroxysmal nocturnal dyspnea or shortness of breath Dermatological: negative for rash Respiratory: negative for cough or wheezing Urologic: negative for hematuria Abdominal: negative for nausea, vomiting, diarrhea, bright red blood per rectum, melena, or hematemesis Neurologic: negative for visual changes, syncope, or dizziness All other systems reviewed and are otherwise negative except as noted above.    Blood pressure 104/76, pulse 62, height 6\' 3"  (1.905 m), weight 230 lb (104.3 kg), SpO2 94%.  General appearance: alert and no distress Neck: no  adenopathy, no carotid bruit, no JVD, supple, symmetrical, trachea midline, and thyroid not enlarged, symmetric, no tenderness/mass/nodules Lungs: clear to auscultation bilaterally Heart: regular rate and rhythm, S1, S2 normal, no murmur, click, rub or gallop Extremities: extremities normal, atraumatic, no cyanosis or edema Pulses: 2+ and symmetric Skin: Skin color, texture, turgor normal. No rashes or lesions Neurologic: Grossly normal  EKG EKG Interpretation Date/Time:  Monday March 26 2024 15:01:13 EDT Ventricular Rate:  62 PR Interval:  178 QRS Duration:  112 QT  Interval:  408 QTC Calculation: 414 R Axis:   -28  Text Interpretation: Normal sinus rhythm Normal ECG No previous ECGs available Confirmed by Nanetta Batty [phone]) on 03/26/2024 3:02:37 PM    ASSESSMENT AND PLAN:   Hypercholesteremia History of hyperlipidemia not on statin therapy with lipid profile performed 07/16/2023 revealing total cholesterol 224, LDL 141 and HDL of 36.  He did have a coronary calcium score performed 08/07/2023 which was 0.  LDL goal less than 100.  I am going to start him on atorvastatin 20 mg a day and we will recheck a lipid liver profile in 3 months.  Palpitations Patient had the flu last month with symptoms that lasted for about 2 weeks.  After that he developed palpitations.  He had a Zio patch which she wore for 2 weeks that showed runs of SVT and NSVT.  These have progressively gotten better over time.  I am going to get a 2D echo to further evaluate.     Runell Gess MD FACP,FACC,FAHA, [facility] 03/26/2024 3:22 PM

## 2024-03-26 NOTE — Assessment & Plan Note (Signed)
 Patient had the flu last month with symptoms that lasted for about 2 weeks.  After that he developed palpitations.  He had a Zio patch which she wore for 2 weeks that showed runs of SVT and NSVT.  These have progressively gotten better over time.  I am going to get a 2D echo to further evaluate.

## 2024-03-26 NOTE — Assessment & Plan Note (Signed)
 History of hyperlipidemia not on statin therapy with lipid profile performed 07/16/2023 revealing total cholesterol 224, LDL 141 and HDL of 36.  He did have a coronary calcium score performed 08/07/2023 which was 0.  LDL goal less than 100.  I am going to start him on atorvastatin 20 mg a day and we will recheck a lipid liver profile in 3 months.

## 2024-04-03 DIAGNOSIS — I10 Essential (primary) hypertension: Secondary | ICD-10-CM | POA: Diagnosis not present

## 2024-04-03 DIAGNOSIS — G4700 Insomnia, unspecified: Secondary | ICD-10-CM | POA: Diagnosis not present

## 2024-04-03 DIAGNOSIS — G4719 Other hypersomnia: Secondary | ICD-10-CM | POA: Diagnosis not present

## 2024-04-18 DIAGNOSIS — G4733 Obstructive sleep apnea (adult) (pediatric): Secondary | ICD-10-CM | POA: Diagnosis not present

## 2024-04-23 DIAGNOSIS — M5022 Other cervical disc displacement, mid-cervical region, unspecified level: Secondary | ICD-10-CM | POA: Diagnosis not present

## 2024-04-23 DIAGNOSIS — M9902 Segmental and somatic dysfunction of thoracic region: Secondary | ICD-10-CM | POA: Diagnosis not present

## 2024-04-23 DIAGNOSIS — M5124 Other intervertebral disc displacement, thoracic region: Secondary | ICD-10-CM | POA: Diagnosis not present

## 2024-04-23 DIAGNOSIS — G4733 Obstructive sleep apnea (adult) (pediatric): Secondary | ICD-10-CM | POA: Diagnosis not present

## 2024-04-23 DIAGNOSIS — M9903 Segmental and somatic dysfunction of lumbar region: Secondary | ICD-10-CM | POA: Diagnosis not present

## 2024-04-23 DIAGNOSIS — M9901 Segmental and somatic dysfunction of cervical region: Secondary | ICD-10-CM | POA: Diagnosis not present

## 2024-04-23 DIAGNOSIS — M5126 Other intervertebral disc displacement, lumbar region: Secondary | ICD-10-CM | POA: Diagnosis not present

## 2024-04-23 DIAGNOSIS — G4700 Insomnia, unspecified: Secondary | ICD-10-CM | POA: Diagnosis not present

## 2024-04-26 ENCOUNTER — Ambulatory Visit (HOSPITAL_COMMUNITY): Attending: Cardiovascular Disease

## 2024-04-26 DIAGNOSIS — I10 Essential (primary) hypertension: Secondary | ICD-10-CM | POA: Insufficient documentation

## 2024-04-26 DIAGNOSIS — R002 Palpitations: Secondary | ICD-10-CM | POA: Diagnosis not present

## 2024-04-26 DIAGNOSIS — E7800 Pure hypercholesterolemia, unspecified: Secondary | ICD-10-CM | POA: Diagnosis not present

## 2024-04-26 LAB — ECHOCARDIOGRAM COMPLETE
Area-P 1/2: 2.68 cm2
S' Lateral: 3.6 cm

## 2024-05-02 DIAGNOSIS — M5022 Other cervical disc displacement, mid-cervical region, unspecified level: Secondary | ICD-10-CM | POA: Diagnosis not present

## 2024-05-02 DIAGNOSIS — M9901 Segmental and somatic dysfunction of cervical region: Secondary | ICD-10-CM | POA: Diagnosis not present

## 2024-05-02 DIAGNOSIS — M9903 Segmental and somatic dysfunction of lumbar region: Secondary | ICD-10-CM | POA: Diagnosis not present

## 2024-05-02 DIAGNOSIS — M9902 Segmental and somatic dysfunction of thoracic region: Secondary | ICD-10-CM | POA: Diagnosis not present

## 2024-05-02 DIAGNOSIS — M5124 Other intervertebral disc displacement, thoracic region: Secondary | ICD-10-CM | POA: Diagnosis not present

## 2024-05-02 DIAGNOSIS — M5126 Other intervertebral disc displacement, lumbar region: Secondary | ICD-10-CM | POA: Diagnosis not present

## 2024-05-22 DIAGNOSIS — M5124 Other intervertebral disc displacement, thoracic region: Secondary | ICD-10-CM | POA: Diagnosis not present

## 2024-05-22 DIAGNOSIS — M9901 Segmental and somatic dysfunction of cervical region: Secondary | ICD-10-CM | POA: Diagnosis not present

## 2024-05-22 DIAGNOSIS — M5022 Other cervical disc displacement, mid-cervical region, unspecified level: Secondary | ICD-10-CM | POA: Diagnosis not present

## 2024-05-22 DIAGNOSIS — M5126 Other intervertebral disc displacement, lumbar region: Secondary | ICD-10-CM | POA: Diagnosis not present

## 2024-05-22 DIAGNOSIS — M9903 Segmental and somatic dysfunction of lumbar region: Secondary | ICD-10-CM | POA: Diagnosis not present

## 2024-05-22 DIAGNOSIS — M9902 Segmental and somatic dysfunction of thoracic region: Secondary | ICD-10-CM | POA: Diagnosis not present

## 2024-05-25 DIAGNOSIS — M7672 Peroneal tendinitis, left leg: Secondary | ICD-10-CM | POA: Diagnosis not present

## 2024-05-25 DIAGNOSIS — M216X2 Other acquired deformities of left foot: Secondary | ICD-10-CM | POA: Diagnosis not present

## 2024-05-31 DIAGNOSIS — R972 Elevated prostate specific antigen [PSA]: Secondary | ICD-10-CM | POA: Diagnosis not present

## 2024-05-31 DIAGNOSIS — G4733 Obstructive sleep apnea (adult) (pediatric): Secondary | ICD-10-CM | POA: Diagnosis not present

## 2024-05-31 DIAGNOSIS — Z79899 Other long term (current) drug therapy: Secondary | ICD-10-CM | POA: Diagnosis not present

## 2024-06-07 DIAGNOSIS — N401 Enlarged prostate with lower urinary tract symptoms: Secondary | ICD-10-CM | POA: Diagnosis not present

## 2024-06-07 DIAGNOSIS — N5201 Erectile dysfunction due to arterial insufficiency: Secondary | ICD-10-CM | POA: Diagnosis not present

## 2024-06-07 DIAGNOSIS — R972 Elevated prostate specific antigen [PSA]: Secondary | ICD-10-CM | POA: Diagnosis not present

## 2024-06-07 DIAGNOSIS — R351 Nocturia: Secondary | ICD-10-CM | POA: Diagnosis not present

## 2024-06-08 ENCOUNTER — Other Ambulatory Visit: Payer: Self-pay | Admitting: Urology

## 2024-06-08 DIAGNOSIS — N401 Enlarged prostate with lower urinary tract symptoms: Secondary | ICD-10-CM

## 2024-06-08 DIAGNOSIS — R351 Nocturia: Secondary | ICD-10-CM

## 2024-06-08 DIAGNOSIS — N5201 Erectile dysfunction due to arterial insufficiency: Secondary | ICD-10-CM

## 2024-06-11 ENCOUNTER — Encounter: Payer: Self-pay | Admitting: Urology

## 2024-06-19 DIAGNOSIS — L0390 Cellulitis, unspecified: Secondary | ICD-10-CM | POA: Diagnosis not present

## 2024-06-21 ENCOUNTER — Other Ambulatory Visit: Payer: Self-pay | Admitting: Urology

## 2024-06-21 DIAGNOSIS — R002 Palpitations: Secondary | ICD-10-CM | POA: Diagnosis not present

## 2024-06-21 DIAGNOSIS — N5201 Erectile dysfunction due to arterial insufficiency: Secondary | ICD-10-CM

## 2024-06-21 DIAGNOSIS — N401 Enlarged prostate with lower urinary tract symptoms: Secondary | ICD-10-CM

## 2024-06-21 DIAGNOSIS — E039 Hypothyroidism, unspecified: Secondary | ICD-10-CM | POA: Diagnosis not present

## 2024-06-21 DIAGNOSIS — F5101 Primary insomnia: Secondary | ICD-10-CM | POA: Diagnosis not present

## 2024-06-21 DIAGNOSIS — M79672 Pain in left foot: Secondary | ICD-10-CM | POA: Diagnosis not present

## 2024-06-21 DIAGNOSIS — I10 Essential (primary) hypertension: Secondary | ICD-10-CM | POA: Diagnosis not present

## 2024-06-21 DIAGNOSIS — G4733 Obstructive sleep apnea (adult) (pediatric): Secondary | ICD-10-CM | POA: Diagnosis not present

## 2024-06-21 DIAGNOSIS — R351 Nocturia: Secondary | ICD-10-CM

## 2024-06-21 DIAGNOSIS — R972 Elevated prostate specific antigen [PSA]: Secondary | ICD-10-CM | POA: Diagnosis not present

## 2024-06-27 DIAGNOSIS — M5022 Other cervical disc displacement, mid-cervical region, unspecified level: Secondary | ICD-10-CM | POA: Diagnosis not present

## 2024-06-27 DIAGNOSIS — M5126 Other intervertebral disc displacement, lumbar region: Secondary | ICD-10-CM | POA: Diagnosis not present

## 2024-06-27 DIAGNOSIS — M9902 Segmental and somatic dysfunction of thoracic region: Secondary | ICD-10-CM | POA: Diagnosis not present

## 2024-06-27 DIAGNOSIS — M9901 Segmental and somatic dysfunction of cervical region: Secondary | ICD-10-CM | POA: Diagnosis not present

## 2024-06-27 DIAGNOSIS — M5124 Other intervertebral disc displacement, thoracic region: Secondary | ICD-10-CM | POA: Diagnosis not present

## 2024-06-27 DIAGNOSIS — M9903 Segmental and somatic dysfunction of lumbar region: Secondary | ICD-10-CM | POA: Diagnosis not present

## 2024-06-28 ENCOUNTER — Encounter: Payer: Self-pay | Admitting: Podiatry

## 2024-06-28 ENCOUNTER — Ambulatory Visit (INDEPENDENT_AMBULATORY_CARE_PROVIDER_SITE_OTHER)

## 2024-06-28 ENCOUNTER — Ambulatory Visit: Admitting: Podiatry

## 2024-06-28 VITALS — Ht 75.0 in | Wt 230.0 lb

## 2024-06-28 DIAGNOSIS — M7672 Peroneal tendinitis, left leg: Secondary | ICD-10-CM | POA: Diagnosis not present

## 2024-06-28 DIAGNOSIS — M7752 Other enthesopathy of left foot: Secondary | ICD-10-CM | POA: Diagnosis not present

## 2024-06-28 DIAGNOSIS — M778 Other enthesopathies, not elsewhere classified: Secondary | ICD-10-CM

## 2024-06-28 NOTE — Progress Notes (Signed)
 Subjective:   Patient ID: Joel Franco, male   DOB: 68 y.o.   MRN: [id number]   HPI Patient states he has periodic left midfoot pain and states that he has had orthotics made by another doctor and has had shoe gear modifications done and states that it only occurs a few times a month and he still working full-time installing floors.  Patient does not smoke and is active   Review of Systems  All other systems reviewed and are negative.       Objective:  Physical Exam Vitals and nursing note reviewed.  Constitutional:      Appearance: He is well-developed.  Pulmonary:     Effort: Pulmonary effort is normal.  Musculoskeletal:        General: Normal range of motion.  Skin:    General: Skin is warm.  Neurological:     Mental Status: He is alert.     Neurovascular status found to be intact muscle strength was found to be adequate range of motion adequate with patient noted to have no current pain in the left with pain that is mostly in the midfoot and seems to calm at different times but sporadic and is not consistent with patient stating that orthotics and good shoe gear seems to help the condition     Assessment:  Appears to be a inflammatory mechanical condition left that does not have any type of consistency with it and is difficult to understand where it may come from     Plan:  H&P reviewed and at this point I do not see any further treatment except new orthotics which may be necessary at 1 point in the future.  I discussed the types of shoes I think would do best for him and what to look for and orthotics and that we can make him a new pair depending on how his symptoms are progressing.  If this becomes more consistent I want to see this back  X-rays were negative for signs of fracture or arthritis associated with the condition the patient is experiencing

## 2024-07-10 DIAGNOSIS — M5126 Other intervertebral disc displacement, lumbar region: Secondary | ICD-10-CM | POA: Diagnosis not present

## 2024-07-10 DIAGNOSIS — M1812 Unilateral primary osteoarthritis of first carpometacarpal joint, left hand: Secondary | ICD-10-CM | POA: Diagnosis not present

## 2024-07-10 DIAGNOSIS — M9902 Segmental and somatic dysfunction of thoracic region: Secondary | ICD-10-CM | POA: Diagnosis not present

## 2024-07-10 DIAGNOSIS — M9901 Segmental and somatic dysfunction of cervical region: Secondary | ICD-10-CM | POA: Diagnosis not present

## 2024-07-10 DIAGNOSIS — M79645 Pain in left finger(s): Secondary | ICD-10-CM | POA: Diagnosis not present

## 2024-07-10 DIAGNOSIS — M9903 Segmental and somatic dysfunction of lumbar region: Secondary | ICD-10-CM | POA: Diagnosis not present

## 2024-07-10 DIAGNOSIS — M5022 Other cervical disc displacement, mid-cervical region, unspecified level: Secondary | ICD-10-CM | POA: Diagnosis not present

## 2024-07-10 DIAGNOSIS — M5124 Other intervertebral disc displacement, thoracic region: Secondary | ICD-10-CM | POA: Diagnosis not present

## 2024-07-11 DIAGNOSIS — E039 Hypothyroidism, unspecified: Secondary | ICD-10-CM | POA: Diagnosis not present

## 2024-07-23 ENCOUNTER — Ambulatory Visit
Admission: RE | Admit: 2024-07-23 | Discharge: 2024-07-23 | Disposition: A | Discharge status: Home / Self Care | Source: Ambulatory Visit | Attending: Urology | Admitting: Urology

## 2024-07-23 DIAGNOSIS — N401 Enlarged prostate with lower urinary tract symptoms: Secondary | ICD-10-CM | POA: Diagnosis not present

## 2024-07-23 DIAGNOSIS — R351 Nocturia: Secondary | ICD-10-CM

## 2024-07-23 DIAGNOSIS — N5201 Erectile dysfunction due to arterial insufficiency: Secondary | ICD-10-CM

## 2024-07-23 MED ORDER — GADOPICLENOL 0.5 MMOL/ML IV SOLN
10.0000 mL | Freq: Once | INTRAVENOUS | Status: AC | PRN
  Administered 2024-07-23: 10 mL via INTRAVENOUS

## 2024-07-24 DIAGNOSIS — F5101 Primary insomnia: Secondary | ICD-10-CM | POA: Diagnosis not present

## 2024-07-24 DIAGNOSIS — M5126 Other intervertebral disc displacement, lumbar region: Secondary | ICD-10-CM | POA: Diagnosis not present

## 2024-07-24 DIAGNOSIS — R7303 Prediabetes: Secondary | ICD-10-CM | POA: Diagnosis not present

## 2024-07-24 DIAGNOSIS — N401 Enlarged prostate with lower urinary tract symptoms: Secondary | ICD-10-CM | POA: Diagnosis not present

## 2024-07-24 DIAGNOSIS — M79672 Pain in left foot: Secondary | ICD-10-CM | POA: Diagnosis not present

## 2024-07-24 DIAGNOSIS — Z0000 Encounter for general adult medical examination without abnormal findings: Secondary | ICD-10-CM | POA: Diagnosis not present

## 2024-07-24 DIAGNOSIS — M9901 Segmental and somatic dysfunction of cervical region: Secondary | ICD-10-CM | POA: Diagnosis not present

## 2024-07-24 DIAGNOSIS — M9903 Segmental and somatic dysfunction of lumbar region: Secondary | ICD-10-CM | POA: Diagnosis not present

## 2024-07-24 DIAGNOSIS — M5022 Other cervical disc displacement, mid-cervical region, unspecified level: Secondary | ICD-10-CM | POA: Diagnosis not present

## 2024-07-24 DIAGNOSIS — R972 Elevated prostate specific antigen [PSA]: Secondary | ICD-10-CM | POA: Diagnosis not present

## 2024-07-24 DIAGNOSIS — E785 Hyperlipidemia, unspecified: Secondary | ICD-10-CM | POA: Diagnosis not present

## 2024-07-24 DIAGNOSIS — E039 Hypothyroidism, unspecified: Secondary | ICD-10-CM | POA: Diagnosis not present

## 2024-07-24 DIAGNOSIS — M9902 Segmental and somatic dysfunction of thoracic region: Secondary | ICD-10-CM | POA: Diagnosis not present

## 2024-07-24 DIAGNOSIS — M5124 Other intervertebral disc displacement, thoracic region: Secondary | ICD-10-CM | POA: Diagnosis not present

## 2024-07-24 DIAGNOSIS — I10 Essential (primary) hypertension: Secondary | ICD-10-CM | POA: Diagnosis not present

## 2024-07-26 DIAGNOSIS — E785 Hyperlipidemia, unspecified: Secondary | ICD-10-CM | POA: Diagnosis not present

## 2024-07-26 DIAGNOSIS — E559 Vitamin D deficiency, unspecified: Secondary | ICD-10-CM | POA: Diagnosis not present

## 2024-08-22 DIAGNOSIS — M9903 Segmental and somatic dysfunction of lumbar region: Secondary | ICD-10-CM | POA: Diagnosis not present

## 2024-08-22 DIAGNOSIS — M5022 Other cervical disc displacement, mid-cervical region, unspecified level: Secondary | ICD-10-CM | POA: Diagnosis not present

## 2024-08-22 DIAGNOSIS — M5124 Other intervertebral disc displacement, thoracic region: Secondary | ICD-10-CM | POA: Diagnosis not present

## 2024-08-22 DIAGNOSIS — M9901 Segmental and somatic dysfunction of cervical region: Secondary | ICD-10-CM | POA: Diagnosis not present

## 2024-08-22 DIAGNOSIS — M9902 Segmental and somatic dysfunction of thoracic region: Secondary | ICD-10-CM | POA: Diagnosis not present

## 2024-08-22 DIAGNOSIS — M5126 Other intervertebral disc displacement, lumbar region: Secondary | ICD-10-CM | POA: Diagnosis not present

## 2024-08-28 DIAGNOSIS — L299 Pruritus, unspecified: Secondary | ICD-10-CM | POA: Diagnosis not present

## 2024-09-17 DIAGNOSIS — M9902 Segmental and somatic dysfunction of thoracic region: Secondary | ICD-10-CM | POA: Diagnosis not present

## 2024-09-17 DIAGNOSIS — G4733 Obstructive sleep apnea (adult) (pediatric): Secondary | ICD-10-CM | POA: Diagnosis not present

## 2024-09-17 DIAGNOSIS — M9903 Segmental and somatic dysfunction of lumbar region: Secondary | ICD-10-CM | POA: Diagnosis not present

## 2024-09-17 DIAGNOSIS — M5126 Other intervertebral disc displacement, lumbar region: Secondary | ICD-10-CM | POA: Diagnosis not present

## 2024-09-17 DIAGNOSIS — M5124 Other intervertebral disc displacement, thoracic region: Secondary | ICD-10-CM | POA: Diagnosis not present

## 2024-09-17 DIAGNOSIS — I10 Essential (primary) hypertension: Secondary | ICD-10-CM | POA: Diagnosis not present

## 2024-09-17 DIAGNOSIS — M9901 Segmental and somatic dysfunction of cervical region: Secondary | ICD-10-CM | POA: Diagnosis not present

## 2024-09-17 DIAGNOSIS — F5101 Primary insomnia: Secondary | ICD-10-CM | POA: Diagnosis not present

## 2024-09-17 DIAGNOSIS — M5022 Other cervical disc displacement, mid-cervical region, unspecified level: Secondary | ICD-10-CM | POA: Diagnosis not present

## 2024-09-19 ENCOUNTER — Encounter: Payer: Self-pay | Admitting: Cardiovascular Disease

## 2024-09-19 ENCOUNTER — Ambulatory Visit: Attending: Cardiovascular Disease | Admitting: Cardiovascular Disease

## 2024-09-19 VITALS — BP 112/64 | HR 56 | Ht 75.0 in | Wt 232.2 lb

## 2024-09-19 DIAGNOSIS — I10 Essential (primary) hypertension: Secondary | ICD-10-CM | POA: Diagnosis not present

## 2024-09-19 DIAGNOSIS — R002 Palpitations: Secondary | ICD-10-CM | POA: Diagnosis not present

## 2024-09-19 DIAGNOSIS — E7800 Pure hypercholesterolemia, unspecified: Secondary | ICD-10-CM

## 2024-09-19 MED ORDER — ATORVASTATIN CALCIUM 20 MG PO TABS: 20.0000 mg | ORAL_TABLET | Freq: Every day | ORAL | 3 refills | Status: AC

## 2024-09-19 NOTE — Progress Notes (Signed)
 09/19/2024 Joel Franco   Jan 01, 1956  [id number]  Primary Physician Joel Franco LABOR, MD Primary Cardiologist: Joel Franco, [name]  HPI:  Joel Franco is a 68 y.o.   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  He was referred by his PCP, Dr. Dorn Joel, for evaluation of palpitations.  I last saw him in the office 03/26/2024.  His only risk factors are untreated mild hyperlipidemia.  His father had a myocardial infarction at age 75.  He is never had a heart attack or stroke.  He denies chest pain or shortness of breath Joel Franco is fairly active and exercises in the gym doing cardio treadmill and bicycle as well as weights without limitation.  He and his wife both had the flu back in February were treated with Tamiflu.  His symptoms lasted for proxy 2 weeks then resolved.  During that time he developed tachypalpitations which have since gotten much better spontaneously.  He did wear a Zio patch for 2 weeks that showed runs of SVT and NSVT.  He did have a coronary calcium  score performed 08/01/2023 which was 0.  Since I saw him 6 months ago his palpitations have resolved.  Fairly active and completely asymptomatic.  He was prescribed atorvastatin  20 mg a day which apparently he has not been taking.  His lipid profile performed 07/26/2024 revealed total cholesterol 191, LDL 117 and HDL 35, not at goal for primary prevention.   Current Meds  Medication Sig   alfuzosin (UROXATRAL) 10 MG 24 hr tablet Take 10 mg by mouth daily with breakfast.   amLODipine (NORVASC) 5 MG tablet Take 5 mg by mouth daily.   ibuprofen (ADVIL,MOTRIN) 200 MG tablet Take 200 mg by mouth as needed.     levothyroxine  (SYNTHROID ) 137 MCG tablet Take 137 mcg by mouth daily before breakfast.   [DISCONTINUED] atorvastatin  (LIPITOR) 20 MG tablet Take 1 tablet (20 mg  total) by mouth daily.     Allergies  Allergen Reactions   Amoxicillin     Social History   Socioeconomic History   Marital status: Married    Spouse name: Not on file   Number of children: 2   Years of education: Not on file   Highest education level: Not on file  Occupational History   Occupation: Chief of Staff: Production assistant, radio FOR SELF EMPLOYED  Tobacco Use   Smoking status: Former    Current packs/day: 0.00    Types: Cigarettes    Quit date: 12/28/1991    Years since quitting: 32.7   Smokeless tobacco: Never   Tobacco comments:    smoked on and off only occ  Substance and Sexual Activity   Alcohol use: No   Drug use: No   Sexual activity: Not on file  Other Topics Concern   Not on file  Social History Narrative   Not on file   Social Drivers of Health   Financial Resource Strain: Not on file  Food Insecurity: Not on file  Transportation Needs: Not on file  Physical Activity: Not on file  Stress: Not on file  Social Connections: Not on file  Intimate Partner Violence: Not on file     Review of Systems: General: negative for chills, fever, night sweats or weight changes.  Cardiovascular: negative for chest pain, dyspnea on exertion, edema, orthopnea, palpitations, paroxysmal  nocturnal dyspnea or shortness of breath Dermatological: negative for rash Respiratory: negative for cough or wheezing Urologic: negative for hematuria Abdominal: negative for nausea, vomiting, diarrhea, bright red blood per rectum, melena, or hematemesis Neurologic: negative for visual changes, syncope, or dizziness All other systems reviewed and are otherwise negative except as noted above.    Blood pressure 112/64, pulse (!) 56, height 6' 3 (1.905 m), weight 232 lb 3.2 oz (105.3 kg), SpO2 95%.  General appearance: alert and no distress Neck: no adenopathy, no carotid bruit, no JVD, supple, symmetrical, trachea midline, and thyroid  not enlarged, symmetric, no  tenderness/mass/nodules Lungs: clear to auscultation bilaterally Heart: regular rate and rhythm, S1, S2 normal, no murmur, click, rub or gallop Extremities: extremities normal, atraumatic, no cyanosis or edema Pulses: 2+ and symmetric Skin: Skin color, texture, turgor normal. No rashes or lesions Neurologic: Grossly normal  EKG EKG Interpretation Date/Time:  Wednesday September 19 2024 09:06:52 EDT Ventricular Rate:  56 PR Interval:  186 QRS Duration:  102 QT Interval:  420 QTC Calculation: 405 R Axis:   -42  Text Interpretation: Sinus bradycardia with marked sinus arrhythmia Left axis deviation Pulmonary disease pattern When compared with ECG of 26-Mar-2024 15:01, No significant change was found Confirmed by Franco Carrier [phone]) on 09/19/2024 9:27:46 AM    ASSESSMENT AND PLAN:   Hypercholesteremia History of hyperlipidemia on atorvastatin  20 mg which apparently he has not been taking.  His most recent lipid profile performed 07/26/2024 revealed total cholesterol 191, LDL 117 and HDL of 35.  Will recheck a lipid liver profile with him taking his medication in 3 months.  LDL goal less than 100 given his coronary calcium  score of 0.  Palpitations Resolved  Essential hypertension History of essential hypertension with blood pressure measured today at 112/64.  He is on amlodipine.     Carrier Joel Court MD FACP,FACC,FAHA, [facility] 09/19/2024 9:38 AM

## 2024-09-19 NOTE — Patient Instructions (Signed)
 Medication Instructions:  Your physician has recommended you make the following change in your medication:   -Restart atorvastatin  (lipitor) 20mg  once daily in the evening.  *If you need a refill on your cardiac medications before your next appointment, please call your pharmacy*  Lab Work: Your physician recommends that you return for lab work in: 3 months for FASTING lipid/liver panel  If you have labs (blood work) drawn today and your tests are completely normal, you will receive your results only by: MyChart Message (if you have MyChart) OR A paper copy in the mail If you have any lab test that is abnormal or we need to change your treatment, we will call you to review the results.   Follow-Up: At [facility], you and your health needs are our priority.  As part of our continuing mission to provide you with exceptional heart care, our providers are all part of one team.  This team includes your primary Cardiologist (physician) and Advanced Practice Providers or APPs (Physician Assistants and Nurse Practitioners) who all work together to provide you with the care you need, when you need it.  Your next appointment:   12 month(s)  Provider:   Dorn [name], MD

## 2024-09-19 NOTE — Assessment & Plan Note (Signed)
 Resolved

## 2024-09-19 NOTE — Assessment & Plan Note (Signed)
 History of essential hypertension with blood pressure measured today at 112/64.  He is on amlodipine.

## 2024-09-19 NOTE — Assessment & Plan Note (Signed)
 History of hyperlipidemia on atorvastatin  20 mg which apparently he has not been taking.  His most recent lipid profile performed 07/26/2024 revealed total cholesterol 191, LDL 117 and HDL of 35.  Will recheck a lipid liver profile with him taking his medication in 3 months.  LDL goal less than 100 given his coronary calcium  score of 0.

## 2024-09-20 DIAGNOSIS — M79645 Pain in left finger(s): Secondary | ICD-10-CM | POA: Diagnosis not present

## 2024-09-20 DIAGNOSIS — M1812 Unilateral primary osteoarthritis of first carpometacarpal joint, left hand: Secondary | ICD-10-CM | POA: Diagnosis not present

## 2024-09-26 DIAGNOSIS — M9902 Segmental and somatic dysfunction of thoracic region: Secondary | ICD-10-CM | POA: Diagnosis not present

## 2024-09-26 DIAGNOSIS — M5022 Other cervical disc displacement, mid-cervical region, unspecified level: Secondary | ICD-10-CM | POA: Diagnosis not present

## 2024-09-26 DIAGNOSIS — M9901 Segmental and somatic dysfunction of cervical region: Secondary | ICD-10-CM | POA: Diagnosis not present

## 2024-09-26 DIAGNOSIS — M5124 Other intervertebral disc displacement, thoracic region: Secondary | ICD-10-CM | POA: Diagnosis not present

## 2024-09-26 DIAGNOSIS — M9903 Segmental and somatic dysfunction of lumbar region: Secondary | ICD-10-CM | POA: Diagnosis not present

## 2024-09-26 DIAGNOSIS — M5126 Other intervertebral disc displacement, lumbar region: Secondary | ICD-10-CM | POA: Diagnosis not present

## 2024-09-29 DIAGNOSIS — Z23 Encounter for immunization: Secondary | ICD-10-CM | POA: Diagnosis not present

## 2024-10-24 DIAGNOSIS — M9901 Segmental and somatic dysfunction of cervical region: Secondary | ICD-10-CM | POA: Diagnosis not present

## 2024-10-24 DIAGNOSIS — M9902 Segmental and somatic dysfunction of thoracic region: Secondary | ICD-10-CM | POA: Diagnosis not present

## 2024-10-24 DIAGNOSIS — M9903 Segmental and somatic dysfunction of lumbar region: Secondary | ICD-10-CM | POA: Diagnosis not present

## 2024-10-24 DIAGNOSIS — M5126 Other intervertebral disc displacement, lumbar region: Secondary | ICD-10-CM | POA: Diagnosis not present

## 2024-10-24 DIAGNOSIS — M5124 Other intervertebral disc displacement, thoracic region: Secondary | ICD-10-CM | POA: Diagnosis not present

## 2024-10-24 DIAGNOSIS — M5022 Other cervical disc displacement, mid-cervical region, unspecified level: Secondary | ICD-10-CM | POA: Diagnosis not present

## 2024-11-06 DIAGNOSIS — M9902 Segmental and somatic dysfunction of thoracic region: Secondary | ICD-10-CM | POA: Diagnosis not present

## 2024-11-06 DIAGNOSIS — M9901 Segmental and somatic dysfunction of cervical region: Secondary | ICD-10-CM | POA: Diagnosis not present

## 2024-11-06 DIAGNOSIS — M5126 Other intervertebral disc displacement, lumbar region: Secondary | ICD-10-CM | POA: Diagnosis not present

## 2024-11-06 DIAGNOSIS — M5022 Other cervical disc displacement, mid-cervical region, unspecified level: Secondary | ICD-10-CM | POA: Diagnosis not present

## 2024-11-06 DIAGNOSIS — M5124 Other intervertebral disc displacement, thoracic region: Secondary | ICD-10-CM | POA: Diagnosis not present

## 2024-11-06 DIAGNOSIS — M9903 Segmental and somatic dysfunction of lumbar region: Secondary | ICD-10-CM | POA: Diagnosis not present

## 2024-11-26 ENCOUNTER — Ambulatory Visit: Admitting: Dermatology

## 2024-11-27 ENCOUNTER — Ambulatory Visit: Admitting: Dermatology

## 2024-11-27 ENCOUNTER — Encounter: Payer: Self-pay | Admitting: Dermatology

## 2024-11-27 DIAGNOSIS — L821 Other seborrheic keratosis: Secondary | ICD-10-CM | POA: Diagnosis not present

## 2024-11-27 DIAGNOSIS — L853 Xerosis cutis: Secondary | ICD-10-CM | POA: Diagnosis not present

## 2024-11-27 DIAGNOSIS — L720 Epidermal cyst: Secondary | ICD-10-CM

## 2024-11-27 DIAGNOSIS — L299 Pruritus, unspecified: Secondary | ICD-10-CM | POA: Diagnosis not present

## 2024-11-27 NOTE — Patient Instructions (Addendum)
 Recommend over the counter Dermeleve, Gold Bond Rapid Relief Anti-Itch cream (pramoxine + menthol), CeraVe Anti-itch cream or lotion (pramoxine), Sarna lotion (Original- menthol + camphor or Sensitive- pramoxine) or Eucerin 12 hour Itch Relief lotion (menthol) up to 3 times per day to areas on body that are itchy.   Due to recent changes in healthcare laws, you may see results of your pathology and/or laboratory studies on MyChart before the doctors have had a chance to review them. We understand that in some cases there may be results that are confusing or concerning to you. Please understand that not all results are received at the same time and often the doctors may need to interpret multiple results in order to provide you with the best plan of care or course of treatment. Therefore, we ask that you please give us  2 business days to thoroughly review all your results before contacting the office for clarification. Should we see a critical lab result, you will be contacted sooner.   If You Need Anything After Your Visit  If you have any questions or concerns for your doctor, please call our main line at [phone] and press option 4 to reach your doctor's medical assistant. If no one answers, please leave a voicemail as directed and we will return your call as soon as possible. Messages left after 4 pm will be answered the following business day.   You may also send us  a message via MyChart. We typically respond to MyChart messages within 1-2 business days.  For prescription refills, please ask your pharmacy to contact our office. Our fax number is [phone].  If you have an urgent issue when the clinic is closed that cannot wait until the next business day, you can page your doctor at the number below.    Please note that while we do our best to be available for urgent issues outside of office hours, we are not available 24/7.   If you have an urgent issue and are unable to reach us , you  may choose to seek medical care at your doctor's office, retail clinic, urgent care center, or emergency room.  If you have a medical emergency, please immediately call 911 or go to the emergency department.  Pager Numbers  - Dr. Hester: [phone]  - Dr. Jackquline: [phone]  - Dr. Claudene: [phone]   In the event of inclement weather, please call our main line at [phone] for an update on the status of any delays or closures.  Dermatology Medication Tips: Please keep the boxes that topical medications come in in order to help keep track of the instructions about where and how to use these. Pharmacies typically print the medication instructions only on the boxes and not directly on the medication tubes.   If your medication is too expensive, please contact our office at [phone] option 4 or send us  a message through MyChart.   We are unable to tell what your co-pay for medications will be in advance as this is different depending on your insurance coverage. However, we may be able to find a substitute medication at lower cost or fill out paperwork to get insurance to cover a needed medication.   If a prior authorization is required to get your medication covered by your insurance company, please allow us  1-2 business days to complete this process.  Drug prices often vary depending on where the prescription is filled and some pharmacies may offer cheaper prices.  The website www.goodrx.com contains coupons for medications  through different pharmacies. The prices here do not account for what the cost may be with help from insurance (it may be cheaper with your insurance), but the website can give you the price if you did not use any insurance.  - You can print the associated coupon and take it with your prescription to the pharmacy.  - You may also stop by our office during regular business hours and pick up a GoodRx coupon card.  - If you need your prescription sent  electronically to a different pharmacy, notify our office through [facility] or by phone at [phone] option 4.     Si Usted Necesita Algo Despus de Su Visita  Tambin puede enviarnos un mensaje a travs de Clinical Cytogeneticist. Por lo general respondemos a los mensajes de MyChart en el transcurso de 1 a 2 das hbiles.  Para renovar recetas, por favor pida a su farmacia que se ponga en contacto con nuestra oficina. Randi lakes de fax es [place] [phone].  Si tiene un asunto urgente cuando la clnica est cerrada y que no puede esperar hasta el siguiente da hbil, puede llamar/localizar a su doctor(a) al nmero que aparece a continuacin.   Por favor, tenga en cuenta que aunque hacemos todo lo posible para estar disponibles para asuntos urgentes fuera del horario de [place], no estamos disponibles las 24 horas del da, los 7 [address]  [address] de la [place].   Si tiene un problema urgente y no puede comunicarse con nosotros, puede optar por buscar atencin mdica  en el consultorio de su doctor(a), en una clnica privada, en un centro de atencin urgente o en una sala de emergencias.  Si tiene engineer, drilling, por favor llame inmediatamente al 911 o vaya a la sala de emergencias.  Nmeros de bper  - Dr. Hester: [phone]  - Dra. Jackquline: [phone]  - Dr. Claudene: [phone]   En caso de inclemencias del tiempo, por favor llame a landry capes principal al [phone] para una actualizacin sobre el [place] de cualquier retraso o cierre.  Consejos para la medicacin en dermatologa: Por favor, guarde las cajas en las que vienen los medicamentos de uso tpico para ayudarle a seguir las instrucciones sobre dnde y cmo usarlos. Las farmacias generalmente imprimen las instrucciones del medicamento slo en las cajas y no directamente en los tubos del [place].   Si su medicamento es muy caro, por favor, pngase en contacto con landry rieger llamando al [phone] y presione la  opcin 4 o envenos un mensaje a travs de Clinical Cytogeneticist.   No podemos decirle cul ser su copago por los medicamentos por adelantado ya que esto es diferente dependiendo de la cobertura de su seguro. Sin embargo, es posible que podamos encontrar un medicamento sustituto a audiological scientist un formulario para que el seguro cubra el medicamento que se considera necesario.   Si se requiere una autorizacin previa para que su compaa de seguros cubra su medicamento, por favor permtanos de 1 a 2 das hbiles para completar este proceso.  Los precios de los medicamentos varan con frecuencia dependiendo del environmental consultant de dnde se surte la receta y alguna farmacias pueden ofrecer precios ms baratos.  El sitio web www.goodrx.com tiene cupones para medicamentos de health and safety inspector. Los precios aqu no tienen en cuenta lo que podra costar con la ayuda del seguro (puede ser ms barato con su seguro), pero el sitio web puede darle el precio si no utiliz tourist information centre manager.  - Puede imprimir el cupn correspondiente y  llevarlo con su receta a la farmacia.  - Tambin puede pasar por nuestra oficina durante el horario de atencin regular y education officer, museum una tarjeta de cupones de GoodRx.  - Si necesita que su receta se enve electrnicamente a una farmacia diferente, informe a nuestra oficina a travs de MyChart de  o por telfono llamando al [phone] y presione la opcin 4.

## 2024-11-27 NOTE — Progress Notes (Signed)
   Follow-Up Visit   Subjective  Joel Franco is a 68 y.o. male who presents for the following: LOC under right arm, swollen the size of #2 pencil eraser and continued to enlarge, felt sore, a week ago. Noticed it had white spot assumed infection, and popped it causing the swelling to go down. He states it looks a lot better today. Patient states he also has a spot under his right eye that appeared end of summer, and states he has not been using any creams to these areas.    The following portions of the chart were reviewed this encounter and updated as appropriate: medications, allergies, medical history  Review of Systems:  No other skin or systemic complaints except as noted in HPI or Assessment and Plan.  Objective  Well appearing patient in no apparent distress; mood and affect are within normal limits.   A focused examination was performed of the following areas: Face, right underarm   Relevant exam findings are noted in the Assessment and Plan.    Assessment & Plan   SEBORRHEIC KERATOSIS - Stuck-on, waxy, tan-brown papules and/or plaques at right under eye. - Benign-appearing - Discussed benign etiology and prognosis. - Observe - Call for any changes  EPIDERMAL INCLUSION CYST Exam: pink Subcutaneous nodule at right axilla with keratin discharge.  Benign-appearing. Exam most consistent with an epidermal inclusion cyst. Discussed that a cyst is a benign growth that can grow over time and sometimes get irritated or inflamed. Recommend observation if it is not bothersome. Discussed option of surgical excision to remove it if it is growing, symptomatic, or other changes noted. Please call for new or changing lesions so they can be evaluated.  Discussed may get inflamed again, if it does patient to let us  know. Advised to remove would require surgical excision.    Xerosis and pruritus after showers - gave samples of cerave itch relief, dermeleve for itch. Cerave cream and  cerave intensive cream for moisturizers - gave list of OTC anti itch options. Recommend use daily PRN to AA  SEBORRHEIC KERATOSES   EIC (EPIDERMAL INCLUSION CYST)   XEROSIS CUTIS   PRURITUS    Return for As scheduled, TBSE, w/ Dr. Claudene.  IAlmetta Nora, RMA, am acting as scribe for Boneta Claudene, MD .   Documentation: I have reviewed the above documentation for accuracy and completeness, and I agree with the above.  Boneta Claudene, MD

## 2025-01-29 ENCOUNTER — Ambulatory Visit: Payer: Medicare PPO | Admitting: Dermatology

## 2025-02-12 ENCOUNTER — Ambulatory Visit: Admitting: Dermatology
